# Patient Record
Sex: Female | Born: 1980
Health system: Southern US, Community
[De-identification: ages and names within clinical notes are randomized; demographics above are authoritative.]

## PROBLEM LIST (undated history)

## (undated) DIAGNOSIS — G43909 Migraine, unspecified, not intractable, without status migrainosus: Secondary | ICD-10-CM

## (undated) DIAGNOSIS — R87629 Unspecified abnormal cytological findings in specimens from vagina: Secondary | ICD-10-CM

## (undated) DIAGNOSIS — R87619 Unspecified abnormal cytological findings in specimens from cervix uteri: Secondary | ICD-10-CM

## (undated) DIAGNOSIS — N390 Urinary tract infection, site not specified: Secondary | ICD-10-CM

## (undated) DIAGNOSIS — R202 Paresthesia of skin: Secondary | ICD-10-CM

## (undated) DIAGNOSIS — IMO0002 Reserved for concepts with insufficient information to code with codable children: Secondary | ICD-10-CM

## (undated) DIAGNOSIS — F411 Generalized anxiety disorder: Secondary | ICD-10-CM

## (undated) DIAGNOSIS — E559 Vitamin D deficiency, unspecified: Secondary | ICD-10-CM

## (undated) HISTORY — DX: Migraine, unspecified, not intractable, without status migrainosus: G43.909

## (undated) HISTORY — DX: Unspecified abnormal cytological findings in specimens from cervix uteri: R87.619

## (undated) HISTORY — PX: OTHER SURGICAL HISTORY: SHX169

## (undated) HISTORY — PX: COLPOSCOPY: SHX161

## (undated) HISTORY — DX: Unspecified abnormal cytological findings in specimens from vagina: R87.629

## (undated) HISTORY — DX: Paresthesia of skin: R20.2

## (undated) HISTORY — DX: Reserved for concepts with insufficient information to code with codable children: IMO0002

## (undated) HISTORY — DX: Vitamin D deficiency, unspecified: E55.9

## (undated) HISTORY — DX: Urinary tract infection, site not specified: N39.0

## (undated) HISTORY — DX: Generalized anxiety disorder: F41.1

---
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
## 2012-02-18 ENCOUNTER — Encounter: Payer: Self-pay | Admitting: Obstetrics and Gynecology

## 2012-02-18 ENCOUNTER — Ambulatory Visit (INDEPENDENT_AMBULATORY_CARE_PROVIDER_SITE_OTHER): Payer: BC Managed Care – PPO | Admitting: Obstetrics and Gynecology

## 2012-02-18 VITALS — BP 118/76 | Ht 67.0 in | Wt 153.0 lb

## 2012-02-18 DIAGNOSIS — IMO0001 Reserved for inherently not codable concepts without codable children: Secondary | ICD-10-CM

## 2012-02-18 DIAGNOSIS — Z124 Encounter for screening for malignant neoplasm of cervix: Secondary | ICD-10-CM

## 2012-02-18 DIAGNOSIS — Z202 Contact with and (suspected) exposure to infections with a predominantly sexual mode of transmission: Secondary | ICD-10-CM

## 2012-02-18 DIAGNOSIS — N63 Unspecified lump in unspecified breast: Secondary | ICD-10-CM

## 2012-02-18 DIAGNOSIS — Z309 Encounter for contraceptive management, unspecified: Secondary | ICD-10-CM

## 2012-02-18 NOTE — Progress Notes (Signed)
Last Pap: 07/2010 WNL: Yes Regular Periods:yes Contraception: none  Monthly Breast exam:yes Tetanus<3yrs:yes Nl.Bladder Function:yes Daily BMs:yes Healthy Diet:yes Calcium:yes Mammogram:no Exercise:yes Seatbelt: yes Abuse at home: no Stressful work:no Sigmoid-colonoscopy: n/a Bone Density: No  Pt wants to discuss birth control options.  Pt denies any history of gallbladder,liver disease or history of vte.  Pt eith migraines but no neurologic component BP 118/76  Ht 5\' 7"  (1.702 m)  Wt 153 lb (69.4 kg)  BMI 23.96 kg/m2  LMP 02/06/2012 Pt without complaints Physical Examination: General appearance - alert, well appearing, and in no distress Mental status - normal mood, behavior, speech, dress, motor activity, and thought processes Neck - supple, no significant adenopathy, thyroid exam: thyroid is normal in size without nodules or tenderness Chest - clear to auscultation, no wheezes, rales or rhonchi, symmetric air entry Heart - normal rate and regular rhythm Abdomen - soft, nontender, nondistended, no masses or organomegaly Breasts - breasts appear normal, no suspicious masses, no skin or nipple changes or axillary nodes Pelvic - normal external genitalia, vulva, vagina, cervix, uterus and adnexa Rectal - normal rectal, no masses, rectal exam not indicated Back exam - full range of motion, no tenderness, palpable spasm or pain on motion Neurological - alert, oriented, normal speech, no focal findings or movement disorder noted Musculoskeletal - no joint tenderness, deformity or swelling Extremities - no edema, redness or tenderness in the calves or thighs Skin - normal coloration and turgor, no rashes, no suspicious skin lesions noted Routine exam Pap sent yes Mammogram due no pt chose birth control.  she was given lo loestrin RT 4 months

## 2012-02-18 NOTE — Patient Instructions (Signed)

## 2012-02-19 LAB — HEPATITIS C ANTIBODY: HCV Ab: NEGATIVE

## 2012-02-19 LAB — HIV ANTIBODY (ROUTINE TESTING W REFLEX): HIV: NONREACTIVE

## 2012-02-20 LAB — PAP IG, CT-NG, RFX HPV ASCU
Chlamydia Probe Amp: NEGATIVE
GC Probe Amp: NEGATIVE

## 2012-02-24 ENCOUNTER — Telehealth: Payer: Self-pay

## 2012-02-24 NOTE — Telephone Encounter (Signed)
Lm on vm tcb rgd labs 

## 2012-02-24 NOTE — Telephone Encounter (Signed)
Spoke with pt rgd labs informed pap abnl need colpo explained colpo to pt . Pt has appt 03/12/12 at 345 with ND pt voice understandnig

## 2012-02-24 NOTE — Telephone Encounter (Signed)
Message copied by Rolla Plate on Mon Feb 24, 2012  9:43 AM ------      Message from: Jaymes Graff      Created: Sat Feb 22, 2012  1:55 AM       Please schedule pt for colposcopy.

## 2012-03-05 ENCOUNTER — Other Ambulatory Visit: Payer: Self-pay

## 2012-03-06 ENCOUNTER — Ambulatory Visit
Admission: RE | Admit: 2012-03-06 | Discharge: 2012-03-06 | Disposition: A | Discharge status: Home / Self Care | Payer: BC Managed Care – PPO | Source: Ambulatory Visit | Attending: Obstetrics and Gynecology | Admitting: Obstetrics and Gynecology

## 2012-03-06 DIAGNOSIS — N63 Unspecified lump in unspecified breast: Secondary | ICD-10-CM

## 2012-03-12 ENCOUNTER — Encounter: Payer: BC Managed Care – PPO | Admitting: Obstetrics and Gynecology

## 2012-04-01 ENCOUNTER — Telehealth: Payer: Self-pay | Admitting: Obstetrics and Gynecology

## 2012-04-01 NOTE — Telephone Encounter (Signed)
Niccole/resched.

## 2012-04-01 NOTE — Telephone Encounter (Signed)
Spoke with pt rgd msg pt need to r/s colpo ND in surgery 04/15/12 r/s colpo 04/22/12 at 11:15 with ND pt voice understanding

## 2012-04-15 ENCOUNTER — Encounter: Payer: BC Managed Care – PPO | Admitting: Obstetrics and Gynecology

## 2012-04-22 ENCOUNTER — Encounter: Payer: BC Managed Care – PPO | Admitting: Obstetrics and Gynecology

## 2012-04-29 ENCOUNTER — Telehealth: Payer: Self-pay | Admitting: Obstetrics and Gynecology

## 2012-04-29 NOTE — Telephone Encounter (Signed)
Spoke with pt rgd msg need to r/s colpo pt has appt 05/13/12 at 3:15 pt voice understanding

## 2012-05-06 ENCOUNTER — Other Ambulatory Visit: Payer: Self-pay | Admitting: Obstetrics and Gynecology

## 2012-05-06 ENCOUNTER — Other Ambulatory Visit: Payer: Self-pay

## 2012-05-06 MED ORDER — NORETHIN-ETH ESTRAD-FE BIPHAS 1 MG-10 MCG / 10 MCG PO TABS: 1.0000 | ORAL_TABLET | Freq: Every day | ORAL | Status: DC

## 2012-05-06 NOTE — Telephone Encounter (Signed)
NICCOLE/nd pt

## 2012-05-06 NOTE — Telephone Encounter (Signed)
Spoke with pt rgd msg informed rx sent to pharm pt voice understanding 

## 2012-05-13 ENCOUNTER — Encounter: Payer: Self-pay | Admitting: Obstetrics and Gynecology

## 2012-05-13 ENCOUNTER — Ambulatory Visit (INDEPENDENT_AMBULATORY_CARE_PROVIDER_SITE_OTHER): Payer: BC Managed Care – PPO | Admitting: Obstetrics and Gynecology

## 2012-05-13 VITALS — BP 118/76 | Resp 16 | Ht 67.0 in | Wt 152.0 lb

## 2012-05-13 DIAGNOSIS — R87612 Low grade squamous intraepithelial lesion on cytologic smear of cervix (LGSIL): Secondary | ICD-10-CM

## 2012-05-13 NOTE — Patient Instructions (Signed)
Colposcopy Care After Colposcopy is a procedure in which a special tool is used to magnify the surface of the cervix. A tissue sample (biopsy) may also be taken. This sample will be looked at for cervical cancer or other problems. After the test:  You may have some cramping.   Lie down for a few minutes if you feel lightheaded.    You may have some bleeding which should stop in a few days.  HOME CARE  Do not have sex or use tampons for 2 to 3 days or as told.   Only take medicine as told by your doctor.   Continue to take your birth control pills as usual.  Finding out the results of your test Ask when your test results will be ready. Make sure you get your test results. GET HELP RIGHT AWAY IF:  You are bleeding a lot or are passing blood clots.   You develop a fever of 102 F (38.9 C) or higher.   You have abnormal vaginal discharge.   You have cramps that do not go away with medicine.   You feel lightheaded, dizzy, or pass out (faint).  MAKE SURE YOU:   Understand these instructions.   Will watch your condition.   Will get help right away if you are not doing well or get worse.  Document Released: 03/18/2008 Document Revised: 09/19/2011 Document Reviewed: 03/18/2008 Exit[employer] Patient Information 2012 [employer]. 

## 2012-05-13 NOTE — Progress Notes (Signed)
Previous Pap Smear: 02-18-2012  EPITHELIAL CELL ABNORMALITY: SQUAMOUS CELLS LOW-GRADE SQUAMOUS INTRAEPITHELIAL LESION (LSIL) ENCOMPASSING: HPV/MILD DYSPLASIA/CIN1.  Previous Colposcopy: N/A Referred From: Dr.Alaja Goldinger  LMP: 04/30/12 Contraception: Lo Loestrin FE G,P: 0,0colpo adequate AW change at 1 o clock bx at 1 oclock with ECC done  RT 2 weeks

## 2012-06-01 ENCOUNTER — Encounter: Payer: Self-pay | Admitting: Obstetrics and Gynecology

## 2012-06-01 ENCOUNTER — Ambulatory Visit (INDEPENDENT_AMBULATORY_CARE_PROVIDER_SITE_OTHER): Payer: BC Managed Care – PPO | Admitting: Obstetrics and Gynecology

## 2012-06-01 VITALS — BP 104/70 | Ht 67.0 in | Wt 155.0 lb

## 2012-06-01 DIAGNOSIS — N870 Mild cervical dysplasia: Secondary | ICD-10-CM

## 2012-06-01 NOTE — Progress Notes (Signed)
F/u colpo results  Pathology Status: Final result MyChart: Not Released Next appt with me: None Dx: Papanicolaou smear of cervix with low...         Component  Value    Report      Comments:    FINAL DIAGNOSIS: A. Endocervix - Curettage:  Tissue did not survive processing; see gross description. B. Cervix- Biopsy, 1 o'clock:  Low grade squamous intraepithelial lesion (LSIL), mild dysplasia and HPV infection, CIN I.  See comment.  COMMENT: The above findings correlate with the patient's previous pap test ([id number], [facility]).  Daleen Bo, MD, FCAP Electronically Signed CLINICAL HISTORY: 795.03 SOURCE OF SPECIMEN: A: Endocervix - Curettage B: Cervix- Biopsy, 1 o'clock GROSS DESCRIPTION: There are two containers. A. Received in a single container of formalin labeled with the patient's name and "ECC" are multiple tan mucoid pieces measuring less than 0.1 x 0.1 x 0.1 cm. Submitted entirely in cassette A. DISCLAIMER: Due to specimen fragility/size, the specimen may not survive processing. EPT/srt  B. Received in a single container of formalin labeled with the patient's name and "1 o'clock" is one tan soft tissue fragment measuring 0.4 x 0.4 x 0.3 cm. Submitted entirely in cassette B. EPT/srt    BP 104/70  Ht 5\' 7"  (1.702 m)  Wt 155 lb (70.308 kg)  BMI 24.28 kg/m2  LMP 04/30/2012 LGSIL on pap and colpo Pathophysiology explained to the patient RT for pap in 6 months

## 2012-06-16 ENCOUNTER — Telehealth: Payer: Self-pay

## 2012-06-16 NOTE — Telephone Encounter (Signed)
Try calling pt rgd labs number not in service will try again later

## 2012-06-16 NOTE — Telephone Encounter (Signed)
Message copied by Rolla Plate on Tue Jun 16, 2012 10:58 AM ------      Message from: Jaymes Graff      Created: Sat Jun 13, 2012 11:57 PM       Please review colpo results with patient and tell her I recommend a pap every six months for the next year.

## 2012-06-18 NOTE — Telephone Encounter (Signed)
Spoke with pt rgd labs informed colpo consistent with pap repat pap every 6 months pt voice understanding

## 2012-06-18 NOTE — Telephone Encounter (Signed)
Lm on vm tcb rgd labs 

## 2012-10-14 NOTE — L&D Delivery Note (Signed)
Delivery Note I was called to attend this delivery due to rapid progression of 2nd stage labor. At 5:37 AM a viable female was delivered via Vaginal, Spontaneous Delivery (Presentation:LOA ;  ). A loose nuchal cord was reduced, and the body delivered easily with a compound hang posteriorly.  2 minutes afterwards, Dr. Tenny Craw arrived and care was turned over to her.   Catherine Price,Catherine Price 5:45 AM  APGAR: 8, 9; weight pending.   Placenta status: spontaneous, intact.  Cord 3V:    Anesthesia:  Epidural Episiotomy: none Lacerations: second degree Suture Repair: 3-0 vicryl Est. Blood Loss (mL): 350 cc  Mom to postpartum.  Baby to nursery-stable.  Catherine Price,Catherine Price 07/23/2013, 5:43 AM

## 2013-02-17 LAB — OB RESULTS CONSOLE ANTIBODY SCREEN: Antibody Screen: NEGATIVE

## 2013-02-17 LAB — OB RESULTS CONSOLE GC/CHLAMYDIA
Chlamydia: NEGATIVE
Gonorrhea: NEGATIVE

## 2013-02-17 LAB — OB RESULTS CONSOLE HIV ANTIBODY (ROUTINE TESTING): HIV: NONREACTIVE

## 2013-07-22 ENCOUNTER — Encounter (HOSPITAL_COMMUNITY): Payer: Self-pay

## 2013-07-22 ENCOUNTER — Inpatient Hospital Stay (HOSPITAL_COMMUNITY)
Admission: AD | Admit: 2013-07-22 | Discharge: 2013-07-25 | DRG: 373 | Disposition: A | Discharge status: Home / Self Care | Payer: BC Managed Care – PPO | Source: Ambulatory Visit | Attending: Obstetrics and Gynecology | Admitting: Obstetrics and Gynecology

## 2013-07-22 ENCOUNTER — Encounter (HOSPITAL_COMMUNITY): Payer: Self-pay | Admitting: *Deleted

## 2013-07-22 ENCOUNTER — Inpatient Hospital Stay (HOSPITAL_COMMUNITY): Payer: BC Managed Care – PPO | Admitting: Anesthesiology

## 2013-07-22 ENCOUNTER — Inpatient Hospital Stay (HOSPITAL_COMMUNITY)
Admission: AD | Admit: 2013-07-22 | Discharge: 2013-07-22 | Disposition: A | Discharge status: Home / Self Care | Payer: BC Managed Care – PPO | Source: Ambulatory Visit | Attending: Obstetrics and Gynecology | Admitting: Obstetrics and Gynecology

## 2013-07-22 ENCOUNTER — Encounter (HOSPITAL_COMMUNITY): Payer: BC Managed Care – PPO | Admitting: Anesthesiology

## 2013-07-22 DIAGNOSIS — O479 False labor, unspecified: Secondary | ICD-10-CM | POA: Insufficient documentation

## 2013-07-22 LAB — CBC
MCH: 31 pg (ref 26.0–34.0)
MCHC: 35 g/dL (ref 30.0–36.0)
MCV: 88.7 fL (ref 78.0–100.0)
Platelets: 257 10*3/uL (ref 150–400)
RBC: 4.06 MIL/uL (ref 3.87–5.11)

## 2013-07-22 LAB — TYPE AND SCREEN: Antibody Screen: NEGATIVE

## 2013-07-22 MED ORDER — IBUPROFEN 600 MG PO TABS: 600.0000 mg | ORAL_TABLET | Freq: Four times a day (QID) | ORAL | Status: DC | PRN

## 2013-07-22 MED ORDER — OXYTOCIN 40 UNITS IN LACTATED RINGERS INFUSION - SIMPLE MED
62.5000 mL/h | INTRAVENOUS | Status: DC
  Filled 2013-07-22: qty 1000

## 2013-07-22 MED ORDER — OXYTOCIN BOLUS FROM INFUSION
500.0000 mL | INTRAVENOUS | Status: DC
  Administered 2013-07-23: 500 mL via INTRAVENOUS

## 2013-07-22 MED ORDER — LIDOCAINE HCL (PF) 1 % IJ SOLN
30.0000 mL | INTRAMUSCULAR | Status: AC | PRN
  Administered 2013-07-23: 30 mL via SUBCUTANEOUS
  Filled 2013-07-22 (×2): qty 30

## 2013-07-22 MED ORDER — ONDANSETRON HCL 4 MG/2ML IJ SOLN
4.0000 mg | Freq: Four times a day (QID) | INTRAMUSCULAR | Status: DC | PRN
  Administered 2013-07-22: 4 mg via INTRAVENOUS
  Filled 2013-07-22: qty 2

## 2013-07-22 MED ORDER — ACETAMINOPHEN 325 MG PO TABS: 650.0000 mg | ORAL_TABLET | ORAL | Status: DC | PRN

## 2013-07-22 MED ORDER — LACTATED RINGERS IV SOLN
INTRAVENOUS | Status: DC
  Administered 2013-07-22 – 2013-07-23 (×2): via INTRAVENOUS

## 2013-07-22 MED ORDER — PHENYLEPHRINE 40 MCG/ML (10ML) SYRINGE FOR IV PUSH (FOR BLOOD PRESSURE SUPPORT)
80.0000 ug | PREFILLED_SYRINGE | INTRAVENOUS | Status: DC | PRN
  Filled 2013-07-22: qty 5
  Filled 2013-07-22: qty 2

## 2013-07-22 MED ORDER — LACTATED RINGERS IV SOLN
500.0000 mL | INTRAVENOUS | Status: DC | PRN
  Administered 2013-07-23: 500 mL via INTRAVENOUS

## 2013-07-22 MED ORDER — DIPHENHYDRAMINE HCL 50 MG/ML IJ SOLN: 12.5000 mg | INTRAMUSCULAR | Status: DC | PRN

## 2013-07-22 MED ORDER — CITRIC ACID-SODIUM CITRATE 334-500 MG/5ML PO SOLN
30.0000 mL | ORAL | Status: DC | PRN
  Administered 2013-07-22: 30 mL via ORAL
  Filled 2013-07-22: qty 15

## 2013-07-22 MED ORDER — EPHEDRINE 5 MG/ML INJ
10.0000 mg | INTRAVENOUS | Status: DC | PRN
  Filled 2013-07-22: qty 2
  Filled 2013-07-22: qty 4

## 2013-07-22 MED ORDER — OXYCODONE-ACETAMINOPHEN 5-325 MG PO TABS: 1.0000 | ORAL_TABLET | ORAL | Status: DC | PRN

## 2013-07-22 MED ORDER — FLEET ENEMA 7-19 GM/118ML RE ENEM: 1.0000 | ENEMA | RECTAL | Status: DC | PRN

## 2013-07-22 MED ORDER — FENTANYL 2.5 MCG/ML BUPIVACAINE 1/10 % EPIDURAL INFUSION (WH - ANES)
14.0000 mL/h | INTRAMUSCULAR | Status: DC | PRN
  Administered 2013-07-22 – 2013-07-23 (×2): 14 mL/h via EPIDURAL
  Filled 2013-07-22 (×2): qty 125

## 2013-07-22 MED ORDER — PHENYLEPHRINE 40 MCG/ML (10ML) SYRINGE FOR IV PUSH (FOR BLOOD PRESSURE SUPPORT)
80.0000 ug | PREFILLED_SYRINGE | INTRAVENOUS | Status: DC | PRN
  Filled 2013-07-22: qty 2

## 2013-07-22 MED ORDER — BUTORPHANOL TARTRATE 1 MG/ML IJ SOLN: 1.0000 mg | INTRAMUSCULAR | Status: DC | PRN

## 2013-07-22 MED ORDER — LIDOCAINE HCL (PF) 1 % IJ SOLN
INTRAMUSCULAR | Status: DC | PRN
  Administered 2013-07-22 (×2): 5 mL

## 2013-07-22 MED ORDER — LACTATED RINGERS IV SOLN: 500.0000 mL | Freq: Once | INTRAVENOUS | Status: DC

## 2013-07-22 MED ORDER — EPHEDRINE 5 MG/ML INJ
10.0000 mg | INTRAVENOUS | Status: DC | PRN
  Filled 2013-07-22: qty 2

## 2013-07-22 NOTE — MAU Note (Signed)
Contractions every 5-7 minutes since midnight. Denies LOF or VB. Positive fetal movement. sve 1cm on Tuesday.

## 2013-07-22 NOTE — MAU Note (Signed)
C/o uc that started last night around 1940; dilated 2-3 cm this AM in MAU;

## 2013-07-22 NOTE — MAU Note (Signed)
Pt taken directly to rm, had intense look on face when in lobby. No bleeding or leaking, was here this morning, ctx's are stronger.

## 2013-07-22 NOTE — Anesthesia Procedure Notes (Signed)
Epidural Patient location during procedure: OB Start time: 07/22/2013 9:32 PM  Staffing Anesthesiologist: Angus Seller., Harrell Gave. Performed by: anesthesiologist   Preanesthetic Checklist Completed: patient identified, site marked, surgical consent, pre-op evaluation, timeout performed, IV checked, risks and benefits discussed and monitors and equipment checked  Epidural Patient position: sitting Prep: site prepped and draped and DuraPrep Patient monitoring: continuous pulse ox and blood pressure Approach: midline Injection technique: LOR air  Needle:  Needle type: Tuohy  Needle gauge: 17 G Needle length: 9 cm and 9 Needle insertion depth: 5 cm cm Catheter type: closed end flexible Catheter size: 19 Gauge Catheter at skin depth: 10 cm Test dose: negative  Assessment Events: blood not aspirated, injection not painful, no injection resistance, negative IV test and no paresthesia  Additional Notes Patient identified.  Risk benefits discussed including failed block, incomplete pain control, headache, nerve damage, paralysis, blood pressure changes, nausea, vomiting, reactions to medication both toxic or allergic, and postpartum back pain.  Patient expressed understanding and wished to proceed.  All questions were answered.  Sterile technique used throughout procedure and epidural site dressed with sterile barrier dressing. No paresthesia or other complications noted.The patient did not experience any signs of intravascular injection such as tinnitus or metallic taste in mouth nor signs of intrathecal spread such as rapid motor block. Please see nursing notes for vital signs.

## 2013-07-22 NOTE — Anesthesia Preprocedure Evaluation (Signed)

## 2013-07-23 ENCOUNTER — Encounter (HOSPITAL_COMMUNITY): Payer: Self-pay | Admitting: *Deleted

## 2013-07-23 LAB — RPR: RPR Ser Ql: NONREACTIVE

## 2013-07-23 LAB — ABO/RH: ABO/RH(D): O POS

## 2013-07-23 MED ORDER — DIBUCAINE 1 % RE OINT: 1.0000 "application " | TOPICAL_OINTMENT | RECTAL | Status: DC | PRN

## 2013-07-23 MED ORDER — SIMETHICONE 80 MG PO CHEW: 80.0000 mg | CHEWABLE_TABLET | ORAL | Status: DC | PRN

## 2013-07-23 MED ORDER — OXYCODONE-ACETAMINOPHEN 5-325 MG PO TABS: 1.0000 | ORAL_TABLET | ORAL | Status: DC | PRN

## 2013-07-23 MED ORDER — DIPHENHYDRAMINE HCL 25 MG PO CAPS: 25.0000 mg | ORAL_CAPSULE | Freq: Four times a day (QID) | ORAL | Status: DC | PRN

## 2013-07-23 MED ORDER — PRENATAL MULTIVITAMIN CH
1.0000 | ORAL_TABLET | Freq: Every day | ORAL | Status: DC
  Administered 2013-07-23 – 2013-07-24 (×2): 1 via ORAL
  Filled 2013-07-23 (×2): qty 1

## 2013-07-23 MED ORDER — ONDANSETRON HCL 4 MG/2ML IJ SOLN: 4.0000 mg | INTRAMUSCULAR | Status: DC | PRN

## 2013-07-23 MED ORDER — TETANUS-DIPHTH-ACELL PERTUSSIS 5-2.5-18.5 LF-MCG/0.5 IM SUSP: 0.5000 mL | Freq: Once | INTRAMUSCULAR | Status: DC

## 2013-07-23 MED ORDER — SENNOSIDES-DOCUSATE SODIUM 8.6-50 MG PO TABS
2.0000 | ORAL_TABLET | ORAL | Status: DC
  Administered 2013-07-23 – 2013-07-25 (×2): 2 via ORAL
  Filled 2013-07-23 (×2): qty 2

## 2013-07-23 MED ORDER — IBUPROFEN 600 MG PO TABS
600.0000 mg | ORAL_TABLET | Freq: Four times a day (QID) | ORAL | Status: DC
  Administered 2013-07-23 – 2013-07-25 (×8): 600 mg via ORAL
  Filled 2013-07-23 (×8): qty 1

## 2013-07-23 MED ORDER — WITCH HAZEL-GLYCERIN EX PADS: 1.0000 "application " | MEDICATED_PAD | CUTANEOUS | Status: DC | PRN

## 2013-07-23 MED ORDER — ONDANSETRON HCL 4 MG PO TABS: 4.0000 mg | ORAL_TABLET | ORAL | Status: DC | PRN

## 2013-07-23 MED ORDER — ZOLPIDEM TARTRATE 5 MG PO TABS: 5.0000 mg | ORAL_TABLET | Freq: Every evening | ORAL | Status: DC | PRN

## 2013-07-23 MED ORDER — LANOLIN HYDROUS EX OINT: TOPICAL_OINTMENT | CUTANEOUS | Status: DC | PRN

## 2013-07-23 MED ORDER — BENZOCAINE-MENTHOL 20-0.5 % EX AERO
1.0000 "application " | INHALATION_SPRAY | CUTANEOUS | Status: DC | PRN
  Administered 2013-07-23: 1 via TOPICAL
  Filled 2013-07-23: qty 56

## 2013-07-23 NOTE — Anesthesia Postprocedure Evaluation (Signed)
Anesthesia Post Note  Patient: Catherine Price  Procedure(s) Performed: * No procedures listed *  Anesthesia type: Epidural  Patient location: Mother/Baby  Post pain: Pain level controlled  Post assessment: Post-op Vital signs reviewed  Last Vitals:  Filed Vitals:   07/23/13 1315  BP: 112/70  Pulse: 92  Temp: 36.4 C  Resp: 18    Post vital signs: Reviewed  Level of consciousness:alert  Complications: No apparent anesthesia complications

## 2013-07-23 NOTE — H&P (Signed)
Catherine Price is a 32 y.o. female presenting for labor 32 yo G1P0 presents for labor History OB History   Grav Para Term Preterm Abortions TAB SAB Ect Mult Living   1 1 1             Past Medical History  Diagnosis Date  . Migraine headache   . Abnormal Pap smear    Past Surgical History  Procedure Laterality Date  . Rhinoplasty  2002  . Wisdom teeth     Family History: family history includes Diabetes type II in her maternal aunt; Hyperlipidemia in her father and mother; Hypertension in her father and mother. Social History:  reports that she has never smoked. She has never used smokeless tobacco. She reports that she does not drink alcohol or use illicit drugs.   Prenatal Transfer Tool  Maternal Diabetes: No Genetic Screening: Normal Maternal Ultrasounds/Referrals: Normal Fetal Ultrasounds or other Referrals:  None Maternal Substance Abuse:  No Significant Maternal Medications:  None Significant Maternal Lab Results:  None Other Comments:  None  ROS  Dilation: 10 Effacement (%): 100 Station: +1;+2 Exam by:: Bertram Millard, RN Blood pressure 118/82, pulse 109, temperature 98.8 F (37.1 C), temperature source Oral, resp. rate 20, SpO2 97.00%, unknown if currently breastfeeding. Exam Physical Exam  Prenatal labs: ABO, Rh: --/--/O POS, O POS (10/09 1955) Antibody: NEG (10/09 1955) Rubella: Immune (05/07 0000) RPR: NON REACTIVE (10/09 1955)  HBsAg:   neg HIV: Non-reactive (05/07 0000)  GBS:   neg  Assessment/Plan: 1) Admit 2) Epidural on request   Macil Crady H. 07/23/2013, 6:23 AM

## 2013-07-24 LAB — CBC
MCH: 30.6 pg (ref 26.0–34.0)
MCV: 90.6 fL (ref 78.0–100.0)
Platelets: 220 10*3/uL (ref 150–400)
RBC: 3.82 MIL/uL — ABNORMAL LOW (ref 3.87–5.11)
RDW: 14.4 % (ref 11.5–15.5)
WBC: 15.7 10*3/uL — ABNORMAL HIGH (ref 4.0–10.5)

## 2013-07-24 NOTE — Discharge Summary (Signed)
Obstetric Discharge Summary Reason for Admission: onset of labor Prenatal Procedures: none Intrapartum Procedures: spontaneous vaginal delivery Postpartum Procedures: none Complications-Operative and Postpartum: 2 degree perineal laceration Hemoglobin  Date Value Range Status  07/24/2013 11.7* 12.0 - 15.0 g/dL Final     HCT  Date Value Range Status  07/24/2013 34.6* 36.0 - 46.0 % Final    Discharge Diagnoses: Term Pregnancy-delivered  Discharge Information: Date: 07/24/2013 Activity: pelvic rest Diet: routine Medications: Ibuprofen Condition: stable Instructions: refer to practice specific booklet Discharge to: home Follow-up Information   Follow up with Almon Hercules., MD.   Specialty:  Obstetrics and Gynecology   Contact information:   [address] [place] [address] [phone]       Newborn Data: Live born female  Birth Weight: 6 lb 10.8 oz (3028 g) APGAR: 8, 9  Home with mother.  Moishe Schellenberg A 07/24/2013, 9:36 AM

## 2013-07-24 NOTE — Progress Notes (Signed)
Patient is eating, ambulating, voiding.  Pain control is good.  Filed Vitals:   07/23/13 1315 07/23/13 1711 07/23/13 2115 07/24/13 0523  BP: 112/70 120/83 120/75 115/78  Pulse: 92 83 88 92  Temp: 97.6 F (36.4 C) 97.7 F (36.5 C) 97.6 F (36.4 C) 97.9 F (36.6 C)  TempSrc: Oral Oral Oral Oral  Resp: 18 18 20 18   SpO2: 96%  97%     Fundus firm Perineum without swelling.  Lab Results  Component Value Date   WBC 15.7* 07/24/2013   HGB 11.7* 07/24/2013   HCT 34.6* 07/24/2013   MCV 90.6 07/24/2013   PLT 220 07/24/2013    --/--/O POS, O POS (10/09 1955)/RI  A/P Post partum day 1.  Routine care.  Expect d/c per plan.    Noma Quijas A

## 2013-07-25 ENCOUNTER — Encounter (HOSPITAL_COMMUNITY): Payer: Self-pay | Admitting: *Deleted

## 2013-07-25 MED ORDER — INFLUENZA VAC SPLIT QUAD 0.5 ML IM SUSP
0.5000 mL | Freq: Once | INTRAMUSCULAR | Status: AC
  Administered 2013-07-25: 0.5 mL via INTRAMUSCULAR
  Filled 2013-07-25: qty 0.5

## 2013-07-25 NOTE — Progress Notes (Signed)
Patient is eating, ambulating, voiding.  Pain control is good.  Filed Vitals:   07/23/13 2115 07/24/13 0523 07/24/13 1705 07/25/13 0612  BP: 120/75 115/78 118/78 109/63  Pulse: 88 92 88 78  Temp: 97.6 F (36.4 C) 97.9 F (36.6 C) 97.6 F (36.4 C) 98.1 F (36.7 C)  TempSrc: Oral Oral Oral Oral  Resp: 20 18 18 16   SpO2: 97%   98%    Fundus firm Perineum without swelling.  Lab Results  Component Value Date   WBC 15.7* 07/24/2013   HGB 11.7* 07/24/2013   HCT 34.6* 07/24/2013   MCV 90.6 07/24/2013   PLT 220 07/24/2013    --/--/O POS, O POS (10/09 1955)/RI  A/P Post partum day 2.  Routine care.  Expect d/c today.    [name] A

## 2013-12-07 ENCOUNTER — Ambulatory Visit: Payer: BC Managed Care – PPO | Admitting: Family Medicine

## 2013-12-27 ENCOUNTER — Encounter: Payer: Self-pay | Admitting: Family Medicine

## 2013-12-27 ENCOUNTER — Ambulatory Visit (INDEPENDENT_AMBULATORY_CARE_PROVIDER_SITE_OTHER): Payer: BC Managed Care – PPO | Admitting: Family Medicine

## 2013-12-27 VITALS — BP 110/74 | Temp 97.6°F | Ht 66.0 in | Wt 164.0 lb

## 2013-12-27 DIAGNOSIS — Z7689 Persons encountering health services in other specified circumstances: Secondary | ICD-10-CM

## 2013-12-27 DIAGNOSIS — Z8669 Personal history of other diseases of the nervous system and sense organs: Secondary | ICD-10-CM

## 2013-12-27 DIAGNOSIS — Z7189 Other specified counseling: Secondary | ICD-10-CM

## 2013-12-27 DIAGNOSIS — Z13220 Encounter for screening for lipoid disorders: Secondary | ICD-10-CM

## 2013-12-27 NOTE — Progress Notes (Signed)
Chief Complaint  Patient presents with  . Establish Care    HPI:  Catherine Price is here to establish care.  Wants to check lipids and get recs for dermatology. Last PCP and physical: Goes to [place] for her gyn care, all UTD.   Has the following chronic problems and concerns today:  Patient Active Problem List   Diagnosis Date Noted  . CIN I (cervical intraepithelial neoplasia I) 06/01/2012  . Contact with or exposure to venereal diseases 02/18/2012  . Breast lump in upper outer quadrant 02/18/2012    Health Maintenance: UTD  ROS: See pertinent positives and negatives per HPI.  Past Medical History  Diagnosis Date  . Migraine headache   . Abnormal Pap smear   . UTI (urinary tract infection)     Family History  Problem Relation Age of Onset  . Diabetes type II Maternal Aunt   . Hypertension Mother   . Hypertension Father   . Hyperlipidemia Mother   . Hyperlipidemia Father   . Alcoholism      History   Social History  . Marital Status: Married    Spouse Name: N/A    Number of Children: N/A  . Years of Education: N/A   Social History Main Topics  . Smoking status: Never Smoker   . Smokeless tobacco: Never Used  . Alcohol Use: No  . Drug Use: No  . Sexual Activity: Yes   Other Topics Concern  . None   Social History Narrative   Work or School: lab in IT trainer Situation: lives with husband and daughter      Spiritual Beliefs: Christian      Lifestyle: no regular exercise; diet is good             Current outpatient prescriptions:Prenatal Vit-Fe Fumarate-FA (PRENATAL MULTIVITAMIN) TABS tablet, Take 1 tablet by mouth daily at 12 noon., Disp: , Rfl:   EXAM:  Filed Vitals:   12/27/13 1629  BP: 110/74  Temp: 97.6 F (36.4 C)    Body mass index is 26.48 kg/(m^2).  GENERAL: vitals reviewed and listed above, alert, oriented, appears well hydrated and in no acute distress  HEENT: atraumatic, conjunttiva clear, no obvious  abnormalities on inspection of external nose and ears  NECK: no obvious masses on inspection  LUNGS: clear to auscultation bilaterally, no wheezes, rales or rhonchi, good air movement  CV: HRRR, no peripheral edema  MS: moves all extremities without noticeable abnormality  PSYCH: pleasant and cooperative, no obvious depression or anxiety  ASSESSMENT AND PLAN:  Discussed the following assessment and plan:  Encounter to establish care  Hx of migraine headaches  Screening for hyperlipidemia - Plan: Lipid panel  -We reviewed the PMH, PSH, FH, SH, Meds and Allergies. -We provided refills for any medications we will prescribe as needed. -We addressed current concerns per orders and patient instructions. -We have asked for records for pertinent exams, studies, vaccines and notes from previous providers. -We have advised patient to follow up per instructions below. -screening for HLD -follow up as needed  -Patient advised to return or notify a doctor immediately if symptoms worsen or persist or new concerns arise.  Patient Instructions  -We have ordered labs or studies at this visit. It can take up to 1-2 weeks for results and processing. We will contact you with instructions IF your results are abnormal. Normal results will be released to your [facility]. If you have not heard from [place] or can  not find your results in [facility] in 2 weeks please contact our office.  -PLEASE SIGN UP FOR MYCHART TODAY   We recommend the following healthy lifestyle measures: - eat a healthy diet consisting of lots of vegetables, fruits, beans, nuts, seeds, healthy meats such as white chicken and fish and whole grains.  - avoid fried foods, fast food, processed foods, sodas, red meet and other fattening foods.  - get a least 150 minutes of aerobic exercise per week.   Follow up in: as needed and yearly      Daviana Haymaker, Dahlia ClientHANNAH R.

## 2013-12-27 NOTE — Progress Notes (Signed)
Pre visit review using our clinic review tool, if applicable. No additional management support is needed unless otherwise documented below in the visit note. 

## 2013-12-27 NOTE — Patient Instructions (Signed)
-  We have ordered labs or studies at this visit. It can take up to 1-2 weeks for results and processing. We will contact you with instructions IF your results are abnormal. Normal results will be released to your [facility]. If you have not heard from us or can not find your results in [facility] in 2 weeks please contact our office.  -PLEASE SIGN UP FOR MYCHART TODAY   We recommend the following healthy lifestyle measures: - eat a healthy diet consisting of lots of vegetables, fruits, beans, nuts, seeds, healthy meats such as white chicken and fish and whole grains.  - avoid fried foods, fast food, processed foods, sodas, red meet and other fattening foods.  - get a least 150 minutes of aerobic exercise per week.   Follow up in: as needed and yearly

## 2013-12-28 LAB — LIPID PANEL
CHOLESTEROL: 138 mg/dL (ref 0–200)
HDL: 44.3 mg/dL (ref >39.00)
LDL CALC: 78 mg/dL (ref 0–99)
TRIGLYCERIDES: 77 mg/dL (ref 0.0–149.0)
Total CHOL/HDL Ratio: 3
VLDL: 15.4 mg/dL (ref 0.0–40.0)

## 2014-08-15 ENCOUNTER — Encounter: Payer: Self-pay | Admitting: Family Medicine

## 2015-05-16 LAB — OB RESULTS CONSOLE ABO/RH: RH TYPE: POSITIVE

## 2015-05-16 LAB — OB RESULTS CONSOLE GC/CHLAMYDIA
Chlamydia: NEGATIVE
GC PROBE AMP, GENITAL: NEGATIVE

## 2015-05-16 LAB — OB RESULTS CONSOLE HEPATITIS B SURFACE ANTIGEN: Hepatitis B Surface Ag: NEGATIVE

## 2015-05-16 LAB — OB RESULTS CONSOLE RPR: RPR: NONREACTIVE

## 2015-05-16 LAB — OB RESULTS CONSOLE RUBELLA ANTIBODY, IGM: RUBELLA: IMMUNE

## 2015-05-16 LAB — OB RESULTS CONSOLE HIV ANTIBODY (ROUTINE TESTING): HIV: NONREACTIVE

## 2015-05-16 LAB — OB RESULTS CONSOLE ANTIBODY SCREEN: Antibody Screen: NEGATIVE

## 2016-01-03 ENCOUNTER — Encounter (HOSPITAL_COMMUNITY): Payer: Self-pay | Admitting: *Deleted

## 2016-01-03 ENCOUNTER — Telehealth (HOSPITAL_COMMUNITY): Payer: Self-pay | Admitting: *Deleted

## 2016-01-03 LAB — OB RESULTS CONSOLE GBS: GBS: NEGATIVE

## 2016-01-03 NOTE — Telephone Encounter (Signed)
Preadmission screen  

## 2016-01-06 ENCOUNTER — Inpatient Hospital Stay (HOSPITAL_COMMUNITY): Payer: BLUE CROSS/BLUE SHIELD | Admitting: Anesthesiology

## 2016-01-06 ENCOUNTER — Encounter (HOSPITAL_COMMUNITY): Payer: Self-pay

## 2016-01-06 ENCOUNTER — Inpatient Hospital Stay (HOSPITAL_COMMUNITY)
Admission: EM | Admit: 2016-01-06 | Discharge: 2016-01-07 | DRG: 775 | Disposition: A | Discharge status: Home / Self Care | Payer: BLUE CROSS/BLUE SHIELD | Source: Ambulatory Visit | Attending: Obstetrics and Gynecology | Admitting: Obstetrics and Gynecology

## 2016-01-06 DIAGNOSIS — Z811 Family history of alcohol abuse and dependence: Secondary | ICD-10-CM | POA: Diagnosis not present

## 2016-01-06 DIAGNOSIS — Z3A40 40 weeks gestation of pregnancy: Secondary | ICD-10-CM

## 2016-01-06 DIAGNOSIS — Z8249 Family history of ischemic heart disease and other diseases of the circulatory system: Secondary | ICD-10-CM

## 2016-01-06 DIAGNOSIS — Z833 Family history of diabetes mellitus: Secondary | ICD-10-CM

## 2016-01-06 LAB — CBC
HCT: 34.8 % — ABNORMAL LOW (ref 36.0–46.0)
HEMATOCRIT: 36.6 % (ref 36.0–46.0)
HEMOGLOBIN: 12.7 g/dL (ref 12.0–15.0)
Hemoglobin: 12.1 g/dL (ref 12.0–15.0)
MCH: 31.1 pg (ref 26.0–34.0)
MCH: 31.1 pg (ref 26.0–34.0)
MCHC: 34.7 g/dL (ref 30.0–36.0)
MCHC: 34.8 g/dL (ref 30.0–36.0)
MCV: 89.5 fL (ref 78.0–100.0)
MCV: 89.5 fL (ref 78.0–100.0)
Platelets: 258 10*3/uL (ref 150–400)
Platelets: 230 10*3/uL (ref 150–400)
RBC: 4.09 MIL/uL (ref 3.87–5.11)
RBC: 3.89 MIL/uL (ref 3.87–5.11)
RDW: 14.6 % (ref 11.5–15.5)
RDW: 14.6 % (ref 11.5–15.5)
WBC: 11.1 10*3/uL — ABNORMAL HIGH (ref 4.0–10.5)
WBC: 16.2 10*3/uL — AB (ref 4.0–10.5)

## 2016-01-06 LAB — COMPREHENSIVE METABOLIC PANEL
ALBUMIN: 2.7 g/dL — AB (ref 3.5–5.0)
ALK PHOS: 135 U/L — AB (ref 38–126)
ALT: 16 U/L (ref 14–54)
AST: 23 U/L (ref 15–41)
Anion gap: 8 (ref 5–15)
BUN: 9 mg/dL (ref 6–20)
CALCIUM: 8.7 mg/dL — AB (ref 8.9–10.3)
CO2: 24 mmol/L (ref 22–32)
CREATININE: 0.57 mg/dL (ref 0.44–1.00)
Chloride: 105 mmol/L (ref 101–111)
GFR calc Af Amer: >60 mL/min (ref >60)
GFR calc non Af Amer: >60 mL/min (ref >60)
GLUCOSE: 131 mg/dL — AB (ref 65–99)
Potassium: 3.6 mmol/L (ref 3.5–5.1)
SODIUM: 137 mmol/L (ref 135–145)
Total Bilirubin: 0.3 mg/dL (ref 0.3–1.2)
Total Protein: 6.1 g/dL — ABNORMAL LOW (ref 6.5–8.1)

## 2016-01-06 LAB — RPR: RPR Ser Ql: NONREACTIVE

## 2016-01-06 LAB — TYPE AND SCREEN
ABO/RH(D): O POS
ANTIBODY SCREEN: NEGATIVE

## 2016-01-06 LAB — URIC ACID: Uric Acid, Serum: 5.4 mg/dL (ref 2.3–6.6)

## 2016-01-06 MED ORDER — SIMETHICONE 80 MG PO CHEW: 80.0000 mg | CHEWABLE_TABLET | ORAL | Status: DC | PRN

## 2016-01-06 MED ORDER — EPHEDRINE 5 MG/ML INJ
10.0000 mg | INTRAVENOUS | Status: DC | PRN
  Filled 2016-01-06: qty 2

## 2016-01-06 MED ORDER — ONDANSETRON HCL 4 MG PO TABS: 4.0000 mg | ORAL_TABLET | ORAL | Status: DC | PRN

## 2016-01-06 MED ORDER — OXYCODONE-ACETAMINOPHEN 5-325 MG PO TABS
1.0000 | ORAL_TABLET | ORAL | Status: DC | PRN
  Filled 2016-01-06: qty 1

## 2016-01-06 MED ORDER — LACTATED RINGERS IV SOLN
500.0000 mL | INTRAVENOUS | Status: DC | PRN
Start: 2016-01-06 — End: 2016-01-06
  Administered 2016-01-06: 1000 mL via INTRAVENOUS

## 2016-01-06 MED ORDER — ACETAMINOPHEN 325 MG PO TABS: 650.0000 mg | ORAL_TABLET | ORAL | Status: DC | PRN

## 2016-01-06 MED ORDER — OXYCODONE-ACETAMINOPHEN 5-325 MG PO TABS: 2.0000 | ORAL_TABLET | ORAL | Status: DC | PRN

## 2016-01-06 MED ORDER — FENTANYL 2.5 MCG/ML BUPIVACAINE 1/10 % EPIDURAL INFUSION (WH - ANES)
14.0000 mL/h | INTRAMUSCULAR | Status: DC | PRN
  Administered 2016-01-06: 12 mL/h via EPIDURAL
  Filled 2016-01-06: qty 125

## 2016-01-06 MED ORDER — OXYTOCIN BOLUS FROM INFUSION: 500.0000 mL | INTRAVENOUS | Status: DC

## 2016-01-06 MED ORDER — DIBUCAINE 1 % RE OINT: 1.0000 "application " | TOPICAL_OINTMENT | RECTAL | Status: DC | PRN

## 2016-01-06 MED ORDER — ONDANSETRON HCL 4 MG/2ML IJ SOLN: 4.0000 mg | Freq: Four times a day (QID) | INTRAMUSCULAR | Status: DC | PRN

## 2016-01-06 MED ORDER — WITCH HAZEL-GLYCERIN EX PADS: 1.0000 "application " | MEDICATED_PAD | CUTANEOUS | Status: DC | PRN

## 2016-01-06 MED ORDER — PHENYLEPHRINE 40 MCG/ML (10ML) SYRINGE FOR IV PUSH (FOR BLOOD PRESSURE SUPPORT)
80.0000 ug | PREFILLED_SYRINGE | INTRAVENOUS | Status: DC | PRN
  Filled 2016-01-06: qty 20
  Filled 2016-01-06: qty 2

## 2016-01-06 MED ORDER — LIDOCAINE-EPINEPHRINE (PF) 2 %-1:200000 IJ SOLN
INTRAMUSCULAR | Status: DC | PRN
  Administered 2016-01-06: 3 mL

## 2016-01-06 MED ORDER — LACTATED RINGERS IV SOLN: 500.0000 mL | Freq: Once | INTRAVENOUS | Status: DC

## 2016-01-06 MED ORDER — IBUPROFEN 600 MG PO TABS
600.0000 mg | ORAL_TABLET | Freq: Four times a day (QID) | ORAL | Status: DC
  Administered 2016-01-06 – 2016-01-07 (×4): 600 mg via ORAL
  Filled 2016-01-06 (×3): qty 1

## 2016-01-06 MED ORDER — BENZOCAINE-MENTHOL 20-0.5 % EX AERO: 1.0000 "application " | INHALATION_SPRAY | CUTANEOUS | Status: DC | PRN

## 2016-01-06 MED ORDER — DIPHENHYDRAMINE HCL 25 MG PO CAPS: 25.0000 mg | ORAL_CAPSULE | Freq: Four times a day (QID) | ORAL | Status: DC | PRN

## 2016-01-06 MED ORDER — LANOLIN HYDROUS EX OINT: TOPICAL_OINTMENT | CUTANEOUS | Status: DC | PRN

## 2016-01-06 MED ORDER — PHENYLEPHRINE 40 MCG/ML (10ML) SYRINGE FOR IV PUSH (FOR BLOOD PRESSURE SUPPORT)
80.0000 ug | PREFILLED_SYRINGE | INTRAVENOUS | Status: DC | PRN
  Filled 2016-01-06: qty 2

## 2016-01-06 MED ORDER — LACTATED RINGERS IV SOLN
2.5000 [IU]/h | INTRAVENOUS | Status: DC
  Filled 2016-01-06: qty 10

## 2016-01-06 MED ORDER — ZOLPIDEM TARTRATE 5 MG PO TABS: 5.0000 mg | ORAL_TABLET | Freq: Every evening | ORAL | Status: DC | PRN

## 2016-01-06 MED ORDER — LIDOCAINE HCL (PF) 1 % IJ SOLN
30.0000 mL | INTRAMUSCULAR | Status: DC | PRN
Start: 2016-01-06 — End: 2016-01-06
  Filled 2016-01-06: qty 30

## 2016-01-06 MED ORDER — PRENATAL MULTIVITAMIN CH
1.0000 | ORAL_TABLET | Freq: Every day | ORAL | Status: DC
  Administered 2016-01-07: 1 via ORAL
  Filled 2016-01-06: qty 1

## 2016-01-06 MED ORDER — CITRIC ACID-SODIUM CITRATE 334-500 MG/5ML PO SOLN: 30.0000 mL | ORAL | Status: DC | PRN

## 2016-01-06 MED ORDER — SENNOSIDES-DOCUSATE SODIUM 8.6-50 MG PO TABS
2.0000 | ORAL_TABLET | ORAL | Status: DC
  Administered 2016-01-06: 2 via ORAL
  Filled 2016-01-06: qty 2

## 2016-01-06 MED ORDER — TETANUS-DIPHTH-ACELL PERTUSSIS 5-2.5-18.5 LF-MCG/0.5 IM SUSP: 0.5000 mL | Freq: Once | INTRAMUSCULAR | Status: DC

## 2016-01-06 MED ORDER — LACTATED RINGERS IV SOLN
INTRAVENOUS | Status: DC
  Administered 2016-01-06: 09:00:00 via INTRAVENOUS

## 2016-01-06 MED ORDER — DIPHENHYDRAMINE HCL 50 MG/ML IJ SOLN: 12.5000 mg | INTRAMUSCULAR | Status: DC | PRN

## 2016-01-06 MED ORDER — BUPIVACAINE HCL (PF) 0.25 % IJ SOLN
INTRAMUSCULAR | Status: DC | PRN
  Administered 2016-01-06 (×2): 4 mL via EPIDURAL

## 2016-01-06 MED ORDER — ONDANSETRON HCL 4 MG/2ML IJ SOLN: 4.0000 mg | INTRAMUSCULAR | Status: DC | PRN

## 2016-01-06 MED ORDER — FLEET ENEMA 7-19 GM/118ML RE ENEM: 1.0000 | ENEMA | RECTAL | Status: DC | PRN

## 2016-01-06 MED ORDER — OXYCODONE-ACETAMINOPHEN 5-325 MG PO TABS: 1.0000 | ORAL_TABLET | ORAL | Status: DC | PRN

## 2016-01-06 NOTE — Progress Notes (Signed)
Delivery Note At 2:12 PM a viable female was delivered via  (Presentation: ;  ).  APGAR: , ; weight  .   Placenta status:intact , .  Cord: 3 vessels with the following complications:nuchal cord x 1 .  Cord pH: not done  Anesthesia: Epidural  Episiotomy:  none Lacerations:  none Suture Repair: N/A Est. Blood Loss (mL):  200  Mom to postpartum.  Baby to Couplet care / Skin to Skin.  Bria Portales II,Quentavious Rittenhouse E 01/06/2016, 2:19 PM

## 2016-01-06 NOTE — Progress Notes (Signed)
FHT cat one Epidural in Cx 6/C/-2/vtx AROM poss thin mec

## 2016-01-06 NOTE — MAU Note (Addendum)
Contractions for 2.5hrs. Some bloody show. Denies LOF. Was 1cm on Tues

## 2016-01-06 NOTE — Lactation Note (Signed)
This note was copied from a baby's chart. Lactation Consultation Note  P2, Ex BF 7 months. Reviewed hand expression and mother was able to easily express breastmilk. Mother latched baby in cradle hold.  Reminded her to wait until baby opens wide to latch deep on the breast. Baby fussy at the breast.  Attempted in football hold but baby still fussy. Suggest mother place baby STS to relax baby. Mom made aware of O/P services, breastfeeding support groups, community resources, and our phone # for post-discharge questions.  Suggest she call if she needs further assistance.  Patient Name: Catherine Price [id number]'s Date: 01/06/2016 Reason for consult: Initial assessment   Maternal Data Has patient been taught Hand Expression?: Yes Does the patient have breastfeeding experience prior to this delivery?: Yes  Feeding Feeding Type: Breast Fed Length of feed:  (few sucks/fussy)  LATCH Score/Interventions Latch: Repeated attempts needed to sustain latch, nipple held in mouth throughout feeding, stimulation needed to elicit sucking reflex. Intervention(s): Adjust position;Assist with latch;Breast compression  Audible Swallowing: A few with stimulation Intervention(s): Skin to skin;Hand expression  Type of Nipple: Everted at rest and after stimulation  Comfort (Breast/Nipple): Soft / non-tender     Hold (Positioning): No assistance needed to correctly position infant at breast.  LATCH Score: 8  Lactation Tools Discussed/Used     Consult Status Consult Status: Follow-up Date: 01/07/16 Follow-up type: In-patient    Dahlia ByesBerkelhammer, [facility] 01/06/2016, 8:00 PM

## 2016-01-06 NOTE — H&P (Signed)
Sherrlyn HockMonica S Kluger is a 35 y.o. female presenting for UCs. Denies ROM. Maternal Medical History:  Reason for admission: Contractions.   Contractions: Onset was 3-5 hours ago.    Fetal activity: Perceived fetal activity is normal.      OB History    Gravida Para Term Preterm AB TAB SAB Ectopic Multiple Living   2 1 1       1      Past Medical History  Diagnosis Date  . Migraine headache   . Abnormal Pap smear   . UTI (urinary tract infection)   . Vaginal Pap smear, abnormal    Past Surgical History  Procedure Laterality Date  . Rhinoplasty  2002  . Wisdom teeth    . Colposcopy     Family History: family history includes Alcoholism in her brother; Diabetes in her maternal aunt and mother; Diabetes type II in her maternal aunt; Heart attack in her mother; Hyperlipidemia in her father and mother; Hypertension in her father, mother, and sister. Social History:  reports that she has never smoked. She has never used smokeless tobacco. She reports that she does not drink alcohol or use illicit drugs.   Prenatal Transfer Tool  Maternal Diabetes: No Genetic Screening: Normal Maternal Ultrasounds/Referrals: Normal Fetal Ultrasounds or other Referrals:  None Maternal Substance Abuse:  No Significant Maternal Medications:  None Significant Maternal Lab Results:  None Other Comments:  None  Review of Systems  Eyes: Negative for blurred vision.  Gastrointestinal: Negative for abdominal pain.  Neurological: Negative for headaches.    Dilation: 4.5 Effacement (%): 70 Station: -2 Exam by:: ansah-mensah, rnc  Blood pressure 129/92, pulse 91, temperature 97.8 F (36.6 C), temperature source Oral, resp. rate 18, height 5\' 7"  (1.702 m), weight 207 lb 12.8 oz (94.257 kg), last menstrual period 03/24/2015, currently breastfeeding. Maternal Exam:  Uterine Assessment: Contraction strength is moderate.  Contraction frequency is irregular.   Abdomen: Patient reports no abdominal  tenderness.   Fetal Exam Fetal State Assessment: Category I - tracings are normal.     Physical Exam  Cardiovascular: Normal rate and regular rhythm.   Respiratory: Effort normal and breath sounds normal.  GI: Soft. There is no tenderness.  Neurological: She has normal reflexes.    Prenatal labs: ABO, Rh: --/--/O POS (03/25 0405) Antibody: NEG (03/25 0405) Rubella: Immune (08/02 0000) RPR: Nonreactive (08/02 0000)  HBsAg: Negative (08/02 0000)  HIV: Non-reactive (08/02 0000)  GBS: Negative (03/22 0000)   Assessment/Plan: 35 yo G2P1 @ 40 5/7 weeks contracting D/W patient above Epidural prn   Carilyn Woolston II,Srihari Shellhammer E 01/06/2016, 7:34 AM

## 2016-01-06 NOTE — Anesthesia Procedure Notes (Signed)
Epidural Patient location during procedure: OB  Staffing Anesthesiologist: Mayleen Borrero Performed by: anesthesiologist   Preanesthetic Checklist Completed: patient identified, surgical consent, pre-op evaluation, timeout performed, IV checked, risks and benefits discussed and monitors and equipment checked  Epidural Patient position: sitting Prep: DuraPrep Patient monitoring: heart rate, cardiac monitor, continuous pulse ox and blood pressure Approach: midline Location: L3-L4 Injection technique: LOR saline  Needle:  Needle type: Tuohy  Needle gauge: 17 G Needle length: 9 cm Needle insertion depth: 7 cm Catheter type: closed end flexible Catheter size: 19 Gauge Catheter at skin depth: 12 cm Test dose: negative and 2% lidocaine with Epi 1:200 K  Assessment Events: blood not aspirated, injection not painful, no injection resistance, negative IV test and no paresthesia  Additional Notes Reason for block:procedure for pain   

## 2016-01-06 NOTE — Progress Notes (Signed)
Notified of pt arrival in MAU, cervical exam and discomfort level. Will admit to labor and delivery with epidural upon request

## 2016-01-06 NOTE — Progress Notes (Signed)
FHT cat one, good response to scalp stim Cx no change UCs q4-6 cm D/W patient-she wants to wait a while for UCs to improve

## 2016-01-06 NOTE — Anesthesia Preprocedure Evaluation (Signed)
Anesthesia Evaluation  Patient identified by MRN, date of birth, ID band Patient awake    Reviewed: Allergy & Precautions, Patient's Chart, lab work & pertinent test results  History of Anesthesia Complications Negative for: history of anesthetic complications  Airway Mallampati: II  TM Distance: >3 FB Neck ROM: Full    Dental  (+) Teeth Intact   Pulmonary neg pulmonary ROS,    breath sounds clear to auscultation       Cardiovascular negative cardio ROS   Rhythm:Regular     Neuro/Psych  Headaches, negative psych ROS   GI/Hepatic negative GI ROS, Neg liver ROS,   Endo/Other  negative endocrine ROS  Renal/GU negative Renal ROS     Musculoskeletal   Abdominal   Peds  Hematology negative hematology ROS (+)   Anesthesia Other Findings   Reproductive/Obstetrics (+) Pregnancy                             Anesthesia Physical Anesthesia Plan  ASA: II  Anesthesia Plan: Epidural   Post-op Pain Management:    Induction:   Airway Management Planned:   Additional Equipment:   Intra-op Plan:   Post-operative Plan:   Informed Consent: I have reviewed the patients History and Physical, chart, labs and discussed the procedure including the risks, benefits and alternatives for the proposed anesthesia with the patient or authorized representative who has indicated his/her understanding and acceptance.   Dental advisory given  Plan Discussed with: Anesthesiologist  Anesthesia Plan Comments:         Anesthesia Quick Evaluation  

## 2016-01-07 ENCOUNTER — Ambulatory Visit: Payer: Self-pay

## 2016-01-07 LAB — CBC
HEMATOCRIT: 31.2 % — AB (ref 36.0–46.0)
HEMOGLOBIN: 11.3 g/dL — AB (ref 12.0–15.0)
MCH: 32.6 pg (ref 26.0–34.0)
MCHC: 36.2 g/dL — ABNORMAL HIGH (ref 30.0–36.0)
MCV: 89.9 fL (ref 78.0–100.0)
Platelets: 213 10*3/uL (ref 150–400)
RBC: 3.47 MIL/uL — AB (ref 3.87–5.11)
RDW: 14.9 % (ref 11.5–15.5)
WBC: 13.2 10*3/uL — AB (ref 4.0–10.5)

## 2016-01-07 MED ORDER — ACETAMINOPHEN 325 MG PO TABS: 650.0000 mg | ORAL_TABLET | Freq: Four times a day (QID) | ORAL | Status: DC | PRN

## 2016-01-07 MED ORDER — IBUPROFEN 600 MG PO TABS: 600.0000 mg | ORAL_TABLET | Freq: Four times a day (QID) | ORAL | Status: DC | PRN

## 2016-01-07 NOTE — Lactation Note (Signed)
This note was copied from a baby's chart. Lactation Consultation Note  Upon entering baby at the end of 15 min feeding.  Reminded mother to compress/breast to keep baby active. Mother denies problems or questions.  Mother has manual pump. Reviewed engorgement care and monitoring voids/stools.   Patient Name: Boy Darden DatesMonica Innes BJYNW'GToday's Date: 01/07/2016 Reason for consult: Follow-up assessment   Maternal Data    Feeding Feeding Type: Breast Fed Length of feed: 15 min  LATCH Score/Interventions Latch: Grasps breast easily, tongue down, lips flanged, rhythmical sucking. Intervention(s): Breast compression  Audible Swallowing: Spontaneous and intermittent Intervention(s): Skin to skin  Type of Nipple: Everted at rest and after stimulation  Comfort (Breast/Nipple): Soft / non-tender     Hold (Positioning): No assistance needed to correctly position infant at breast.  LATCH Score: 10  Lactation Tools Discussed/Used     Consult Status Consult Status: Complete    Hardie PulleyBerkelhammer, Shanetta Nicolls Boschen 01/07/2016, 7:14 PM

## 2016-01-07 NOTE — Anesthesia Postprocedure Evaluation (Signed)
Anesthesia Post Note  Patient: Catherine Price  Procedure(s) Performed: * No procedures listed *  Patient location during evaluation: Mother Baby Anesthesia Type: Epidural Level of consciousness: awake and alert, oriented and patient cooperative Pain management: pain level controlled Vital Signs Assessment: post-procedure vital signs reviewed and stable Respiratory status: spontaneous breathing Cardiovascular status: stable Postop Assessment: no headache, epidural receding, patient able to bend at knees and no signs of nausea or vomiting Anesthetic complications: no Comments: Denies pain.    Last Vitals:  Filed Vitals:   01/06/16 2130 01/07/16 0530  BP: 116/79 123/71  Pulse: 90 88  Temp: 36.7 C 36.4 C  Resp: 18 18    Last Pain:  Filed Vitals:   01/07/16 0556  PainSc: 0-No pain                 [facility] Ms State HospitalWRINKLE,[name]

## 2016-01-07 NOTE — Discharge Instructions (Signed)
No vaginal entry °No heavy lifting °

## 2016-01-07 NOTE — Discharge Summary (Signed)
Obstetric Discharge Summary Reason for Admission: onset of labor Prenatal Procedures: none Intrapartum Procedures: spontaneous vaginal delivery Postpartum Procedures: none Complications-Operative and Postpartum: none HEMOGLOBIN  Date Value Ref Range Status  01/07/2016 11.3* 12.0 - 15.0 g/dL Final   HCT  Date Value Ref Range Status  01/07/2016 31.2* 36.0 - 46.0 % Final    Physical Exam:  General: alert, cooperative and no distress Lochia: appropriate Uterine Fundus: firm Incision: healing well DVT Evaluation: No evidence of DVT seen on physical exam.  Discharge Diagnoses: Term Pregnancy-delivered  Discharge Information: Date: 01/07/2016 Activity: pelvic rest Diet: routine Medications: PNV and Ibuprofen Condition: stable Instructions: refer to practice specific booklet Discharge to: home Follow-up Information    Follow up In 5 weeks.      Newborn Data: Live born female  Birth Weight: 8 lb 2.7 oz (3705 g) APGAR: 8, 9  Home with mother.  Mak Bonny II,Delvonte Berenson E 01/07/2016, 9:36 AM

## 2016-01-07 NOTE — Progress Notes (Signed)
Discharge teaching reviewed through use of Baby and Me book, no questions or concerns, both parents state an understanding of when to call MD, and home care needs of mom and baby.

## 2016-01-07 NOTE — Progress Notes (Signed)
Post Partum Day 1 Subjective: no complaints, up ad lib, voiding and tolerating PO  Objective: Blood pressure 123/71, pulse 88, temperature 97.5 F (36.4 C), temperature source Oral, resp. rate 18, height 5\' 7"  (1.702 m), weight 207 lb 12.8 oz (94.257 kg), last menstrual period 03/24/2015, SpO2 98 %, unknown if currently breastfeeding.  Physical Exam:  General: alert, cooperative and no distress Lochia: appropriate Uterine Fundus: firm Incision: healing well DVT Evaluation: No evidence of DVT seen on physical exam.   Recent Labs  01/06/16 1750 01/07/16 0512  HGB 12.1 11.3*  HCT 34.8* 31.2*    Assessment/Plan: Discharge home   LOS: 1 day   Catherine Price,Catherine Price E 01/07/2016, 9:33 AM

## 2016-01-08 ENCOUNTER — Inpatient Hospital Stay (HOSPITAL_COMMUNITY): Admission: RE | Admit: 2016-01-08 | Payer: BLUE CROSS/BLUE SHIELD | Source: Ambulatory Visit

## 2016-02-01 ENCOUNTER — Telehealth (HOSPITAL_COMMUNITY): Payer: Self-pay

## 2016-09-16 DIAGNOSIS — D1800 Hemangioma unspecified site: Secondary | ICD-10-CM | POA: Diagnosis not present

## 2016-09-16 DIAGNOSIS — D229 Melanocytic nevi, unspecified: Secondary | ICD-10-CM | POA: Diagnosis not present

## 2016-09-16 DIAGNOSIS — Z6827 Body mass index (BMI) 27.0-27.9, adult: Secondary | ICD-10-CM | POA: Diagnosis not present

## 2016-10-21 DIAGNOSIS — Z6826 Body mass index (BMI) 26.0-26.9, adult: Secondary | ICD-10-CM | POA: Diagnosis not present

## 2016-10-21 DIAGNOSIS — N61 Mastitis without abscess: Secondary | ICD-10-CM | POA: Diagnosis not present

## 2016-12-04 DIAGNOSIS — Z13220 Encounter for screening for lipoid disorders: Secondary | ICD-10-CM | POA: Diagnosis not present

## 2016-12-04 DIAGNOSIS — Z0000 Encounter for general adult medical examination without abnormal findings: Secondary | ICD-10-CM | POA: Diagnosis not present

## 2017-01-20 DIAGNOSIS — Z0000 Encounter for general adult medical examination without abnormal findings: Secondary | ICD-10-CM | POA: Diagnosis not present

## 2017-01-20 DIAGNOSIS — Z6827 Body mass index (BMI) 27.0-27.9, adult: Secondary | ICD-10-CM | POA: Diagnosis not present

## 2018-04-22 DIAGNOSIS — G43909 Migraine, unspecified, not intractable, without status migrainosus: Secondary | ICD-10-CM | POA: Diagnosis not present

## 2018-04-22 DIAGNOSIS — Z6828 Body mass index (BMI) 28.0-28.9, adult: Secondary | ICD-10-CM | POA: Diagnosis not present

## 2018-04-24 DIAGNOSIS — Z13220 Encounter for screening for lipoid disorders: Secondary | ICD-10-CM | POA: Diagnosis not present

## 2018-04-24 DIAGNOSIS — Z1329 Encounter for screening for other suspected endocrine disorder: Secondary | ICD-10-CM | POA: Diagnosis not present

## 2018-04-24 DIAGNOSIS — Z0000 Encounter for general adult medical examination without abnormal findings: Secondary | ICD-10-CM | POA: Diagnosis not present

## 2018-04-24 DIAGNOSIS — Z114 Encounter for screening for human immunodeficiency virus [HIV]: Secondary | ICD-10-CM | POA: Diagnosis not present

## 2018-05-05 DIAGNOSIS — Z0000 Encounter for general adult medical examination without abnormal findings: Secondary | ICD-10-CM | POA: Diagnosis not present

## 2018-05-05 DIAGNOSIS — Z23 Encounter for immunization: Secondary | ICD-10-CM | POA: Diagnosis not present

## 2018-05-05 DIAGNOSIS — Z6828 Body mass index (BMI) 28.0-28.9, adult: Secondary | ICD-10-CM | POA: Diagnosis not present

## 2018-05-25 DIAGNOSIS — Z6828 Body mass index (BMI) 28.0-28.9, adult: Secondary | ICD-10-CM | POA: Diagnosis not present

## 2018-05-25 DIAGNOSIS — S90562A Insect bite (nonvenomous), left ankle, initial encounter: Secondary | ICD-10-CM | POA: Diagnosis not present

## 2018-06-02 DIAGNOSIS — Z6829 Body mass index (BMI) 29.0-29.9, adult: Secondary | ICD-10-CM | POA: Diagnosis not present

## 2018-06-02 DIAGNOSIS — Z01419 Encounter for gynecological examination (general) (routine) without abnormal findings: Secondary | ICD-10-CM | POA: Diagnosis not present

## 2018-11-04 DIAGNOSIS — Z23 Encounter for immunization: Secondary | ICD-10-CM | POA: Diagnosis not present

## 2018-12-14 DIAGNOSIS — G43829 Menstrual migraine, not intractable, without status migrainosus: Secondary | ICD-10-CM | POA: Diagnosis not present

## 2018-12-14 DIAGNOSIS — Z6829 Body mass index (BMI) 29.0-29.9, adult: Secondary | ICD-10-CM | POA: Diagnosis not present

## 2019-05-11 DIAGNOSIS — G43829 Menstrual migraine, not intractable, without status migrainosus: Secondary | ICD-10-CM | POA: Diagnosis not present

## 2019-05-11 DIAGNOSIS — Z3161 Procreative counseling and advice using natural family planning: Secondary | ICD-10-CM | POA: Diagnosis not present

## 2019-05-11 DIAGNOSIS — Z719 Counseling, unspecified: Secondary | ICD-10-CM | POA: Diagnosis not present

## 2019-07-20 DIAGNOSIS — Z6830 Body mass index (BMI) 30.0-30.9, adult: Secondary | ICD-10-CM | POA: Diagnosis not present

## 2019-07-20 DIAGNOSIS — Z01419 Encounter for gynecological examination (general) (routine) without abnormal findings: Secondary | ICD-10-CM | POA: Diagnosis not present

## 2019-07-23 DIAGNOSIS — Z1159 Encounter for screening for other viral diseases: Secondary | ICD-10-CM | POA: Diagnosis not present

## 2019-07-28 DIAGNOSIS — Z0000 Encounter for general adult medical examination without abnormal findings: Secondary | ICD-10-CM | POA: Diagnosis not present

## 2019-07-29 DIAGNOSIS — Z23 Encounter for immunization: Secondary | ICD-10-CM | POA: Diagnosis not present

## 2019-07-29 DIAGNOSIS — Z6829 Body mass index (BMI) 29.0-29.9, adult: Secondary | ICD-10-CM | POA: Diagnosis not present

## 2019-07-29 DIAGNOSIS — Z0000 Encounter for general adult medical examination without abnormal findings: Secondary | ICD-10-CM | POA: Diagnosis not present

## 2019-09-08 DIAGNOSIS — G4700 Insomnia, unspecified: Secondary | ICD-10-CM | POA: Diagnosis not present

## 2019-09-08 DIAGNOSIS — R208 Other disturbances of skin sensation: Secondary | ICD-10-CM | POA: Diagnosis not present

## 2019-09-08 DIAGNOSIS — F411 Generalized anxiety disorder: Secondary | ICD-10-CM | POA: Diagnosis not present

## 2019-09-15 DIAGNOSIS — G43909 Migraine, unspecified, not intractable, without status migrainosus: Secondary | ICD-10-CM | POA: Diagnosis not present

## 2019-09-16 ENCOUNTER — Other Ambulatory Visit: Payer: Self-pay | Admitting: Family Medicine

## 2019-09-16 DIAGNOSIS — R202 Paresthesia of skin: Secondary | ICD-10-CM

## 2019-09-23 DIAGNOSIS — G43909 Migraine, unspecified, not intractable, without status migrainosus: Secondary | ICD-10-CM | POA: Diagnosis not present

## 2019-09-23 DIAGNOSIS — G4700 Insomnia, unspecified: Secondary | ICD-10-CM | POA: Diagnosis not present

## 2019-09-23 DIAGNOSIS — R202 Paresthesia of skin: Secondary | ICD-10-CM | POA: Diagnosis not present

## 2019-09-24 ENCOUNTER — Other Ambulatory Visit: Payer: Self-pay

## 2019-09-24 ENCOUNTER — Ambulatory Visit
Admission: RE | Admit: 2019-09-24 | Discharge: 2019-09-24 | Disposition: A | Discharge status: Home / Self Care | Payer: BC Managed Care – PPO | Source: Ambulatory Visit | Attending: Family Medicine | Admitting: Family Medicine

## 2019-09-24 DIAGNOSIS — R202 Paresthesia of skin: Secondary | ICD-10-CM | POA: Diagnosis not present

## 2019-09-24 MED ORDER — GADOBENATE DIMEGLUMINE 529 MG/ML IV SOLN
17.0000 mL | Freq: Once | INTRAVENOUS | Status: AC | PRN
  Administered 2019-09-24: 17 mL via INTRAVENOUS

## 2019-09-30 DIAGNOSIS — F411 Generalized anxiety disorder: Secondary | ICD-10-CM | POA: Diagnosis not present

## 2019-09-30 DIAGNOSIS — G43909 Migraine, unspecified, not intractable, without status migrainosus: Secondary | ICD-10-CM | POA: Diagnosis not present

## 2019-09-30 DIAGNOSIS — R202 Paresthesia of skin: Secondary | ICD-10-CM | POA: Diagnosis not present

## 2019-10-04 DIAGNOSIS — Z20828 Contact with and (suspected) exposure to other viral communicable diseases: Secondary | ICD-10-CM | POA: Diagnosis not present

## 2019-10-04 DIAGNOSIS — Z03818 Encounter for observation for suspected exposure to other biological agents ruled out: Secondary | ICD-10-CM | POA: Diagnosis not present

## 2019-10-05 ENCOUNTER — Other Ambulatory Visit: Payer: BC Managed Care – PPO

## 2019-10-06 ENCOUNTER — Other Ambulatory Visit: Payer: BC Managed Care – PPO

## 2019-10-16 DIAGNOSIS — Z03818 Encounter for observation for suspected exposure to other biological agents ruled out: Secondary | ICD-10-CM | POA: Diagnosis not present

## 2019-10-16 DIAGNOSIS — Z20828 Contact with and (suspected) exposure to other viral communicable diseases: Secondary | ICD-10-CM | POA: Diagnosis not present

## 2019-10-18 ENCOUNTER — Other Ambulatory Visit: Payer: BC Managed Care – PPO

## 2020-02-24 IMAGING — MR MR HEAD WO/W CM
9 series · 48 of 48 positions shown · IV contrast (multihance)
Comparison: None.

CLINICAL DATA: Intermittent left-sided paresthesias over the last
year. Chronic headaches.

EXAM:
MRI HEAD WITHOUT AND WITH CONTRAST
TECHNIQUE: Multiplanar, multiecho pulse sequences of the brain and surrounding
structures were obtained without and with intravenous contrast.
CONTRAST:  17mL MULTIHANCE GADOBENATE DIMEGLUMINE 529 MG/ML IV SOLN

[Series 5: T1 · sagittal · 4.0mm · 0.75mm/px · 3 of 31 slices shown (1 of 3)]
[im 1/31]
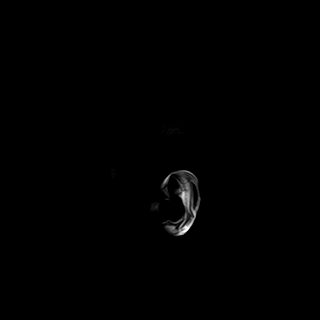
[im 16/31]
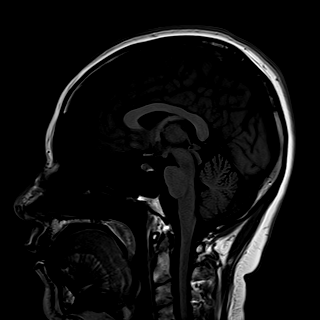
[im 31/31]
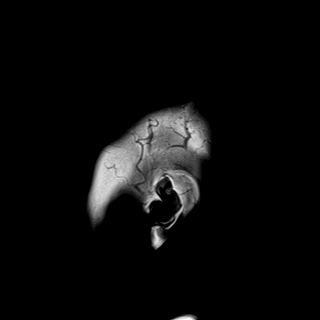

[Series 6: DWI · axial · 3.0mm · 1.44mm/px · z∈[-61,+81]mm · 7 of 88 slices shown (1 of 2)]
[im 1/88]
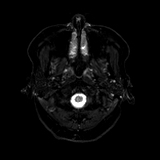
[im 15/88]
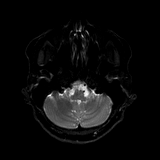
[im 30/88]
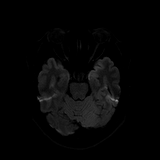
[im 44/88]
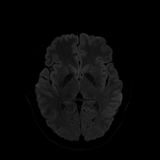
[im 59/88]
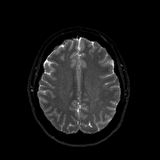
[im 73/88]
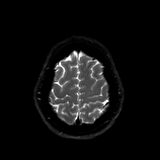
[im 88/88]
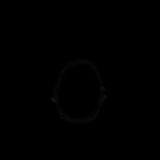

[Series 7: DWI · axial · 3.0mm · 1.44mm/px · z∈[-61,+81]mm · 3 of 43 slices shown (2 of 2)]
[im 1/43]
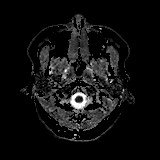
[im 22/43]
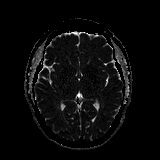
[im 43/43]
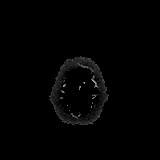

[Series 11: FLAIR · axial · 3.0mm · 0.72mm/px · z∈[-67,+83]mm · 2 of 26 slices shown]
[im 1/26]
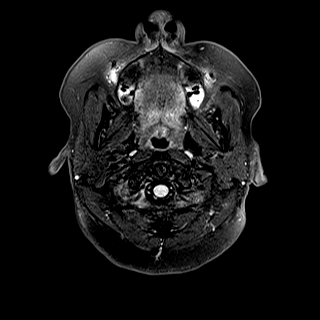
[im 26/26]
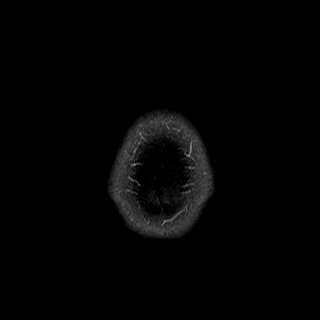

[Series 13: swi_images · axial · 1.5mm · 0.90mm/px · z∈[-64,+79]mm · 7 of 96 slices shown]
[im 1/96]
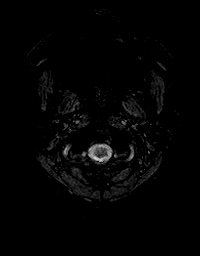
[im 16/96]
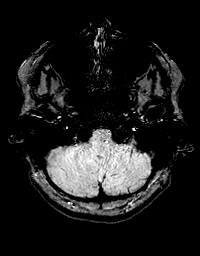
[im 32/96]
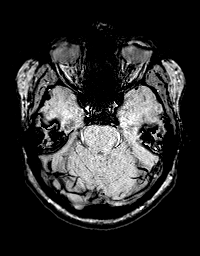
[im 48/96]
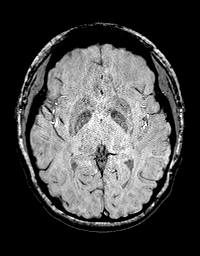
[im 64/96]
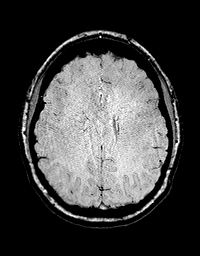
[im 80/96]
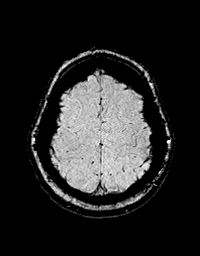
[im 96/96]
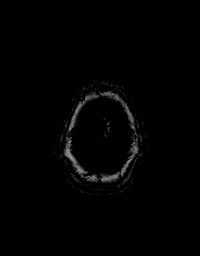

[Series 14: T1 · axial · 1.0mm · 0.94mm/px · z∈[-82,+76]mm · 11 of 160 slices shown (2 of 3)]
[im 1/160]
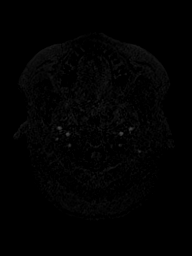
[im 16/160]
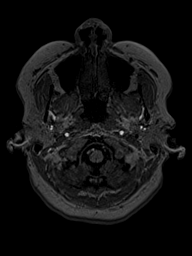
[im 32/160]
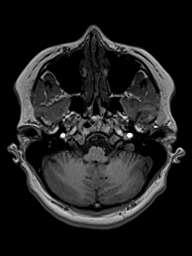
[im 48/160]
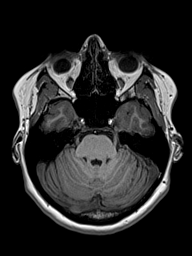
[im 64/160]
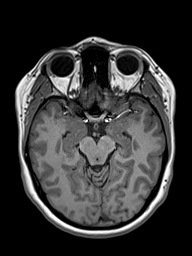
[im 80/160]
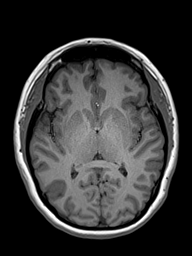
[im 96/160]
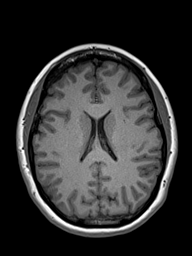
[im 112/160]
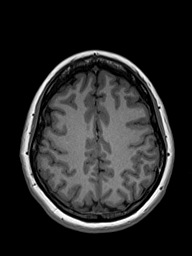
[im 128/160]
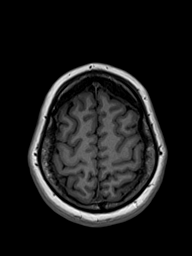
[im 144/160]
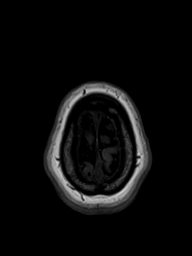
[im 160/160]
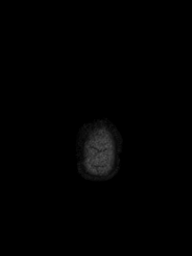

[Series 15: T2 post-contrast · coronal · 4.0mm · 0.36mm/px · 2 of 33 slices shown]
[im 1/33]
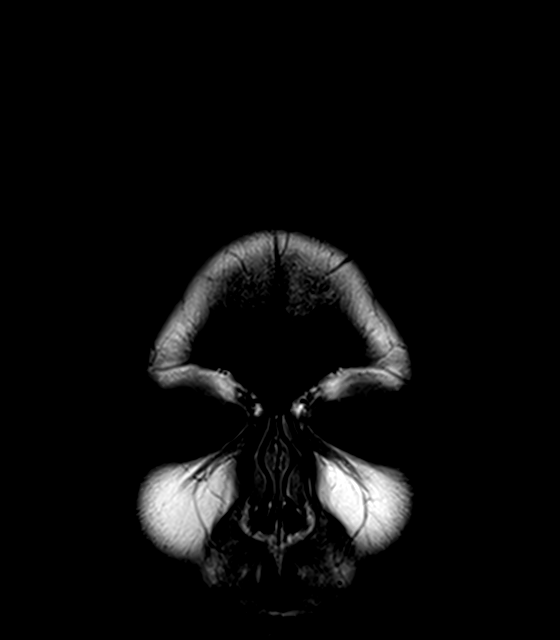
[im 33/33]
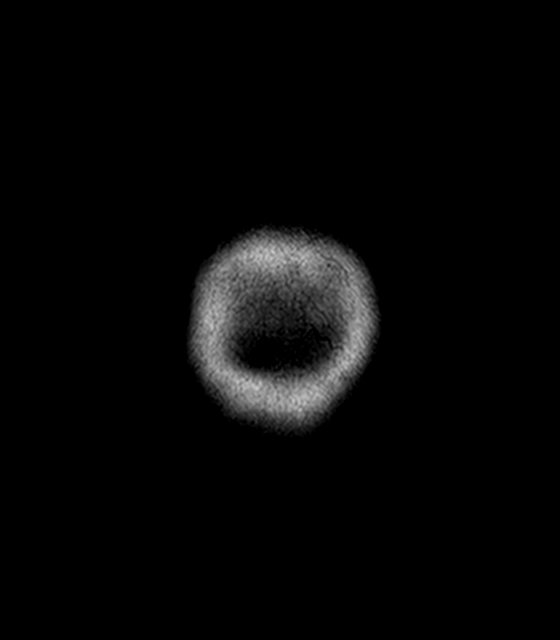

[Series 16: T1 · axial · 1.0mm · 0.94mm/px · z∈[-82,+76]mm · 11 of 160 slices shown (3 of 3)]
[im 1/160]
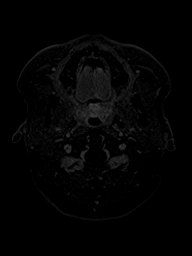
[im 16/160]
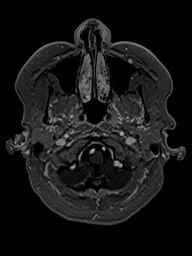
[im 32/160]
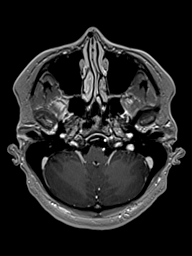
[im 48/160]
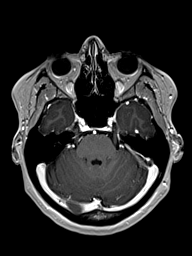
[im 64/160]
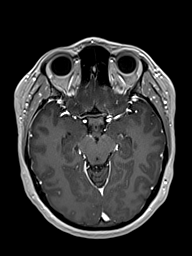
[im 80/160]
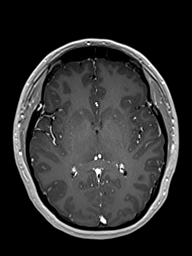
[im 96/160]
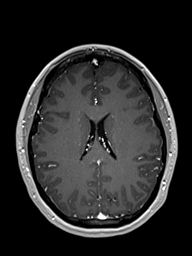
[im 112/160]
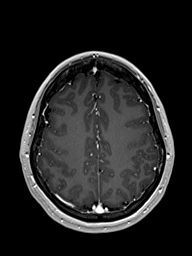
[im 128/160]
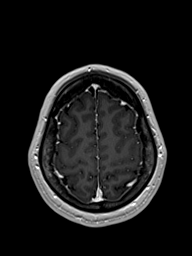
[im 144/160]
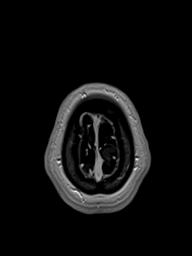
[im 160/160]
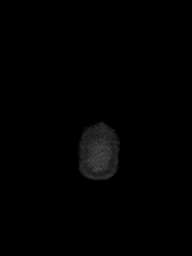

[Series 17: T1 post-contrast · coronal · 4.0mm · 0.72mm/px · 2 of 33 slices shown]
[im 1/33]
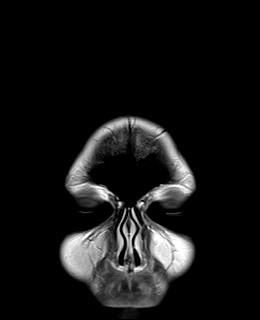
[im 33/33]
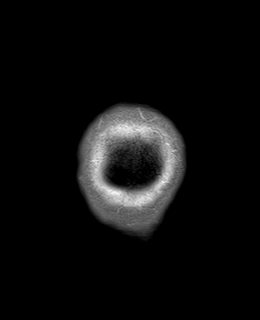

[48 of 48 positions shown; findings below may reference images not displayed]

FINDINGS: Brain: The brain has a normal appearance without evidence of
malformation, atrophy, old or acute small or large vessel
infarction, mass lesion, hemorrhage, hydrocephalus or extra-axial
collection. No sign of demyelinating disease. After contrast
administration, no abnormal enhancement occurs.

Vascular: Major vessels at the base of the brain show flow. Venous
sinuses appear patent.

Skull and upper cervical spine: Normal.

Sinuses/Orbits: Clear/normal.

Other: None significant.
IMPRESSION: Normal examination. No finding to explain the clinical presentation.

## 2020-02-29 DIAGNOSIS — D2262 Melanocytic nevi of left upper limb, including shoulder: Secondary | ICD-10-CM | POA: Diagnosis not present

## 2020-02-29 DIAGNOSIS — D485 Neoplasm of uncertain behavior of skin: Secondary | ICD-10-CM | POA: Diagnosis not present

## 2020-02-29 DIAGNOSIS — D2261 Melanocytic nevi of right upper limb, including shoulder: Secondary | ICD-10-CM | POA: Diagnosis not present

## 2020-02-29 DIAGNOSIS — L821 Other seborrheic keratosis: Secondary | ICD-10-CM | POA: Diagnosis not present

## 2020-04-24 DIAGNOSIS — G43909 Migraine, unspecified, not intractable, without status migrainosus: Secondary | ICD-10-CM | POA: Diagnosis not present

## 2020-04-24 DIAGNOSIS — F411 Generalized anxiety disorder: Secondary | ICD-10-CM | POA: Diagnosis not present

## 2020-04-24 DIAGNOSIS — R200 Anesthesia of skin: Secondary | ICD-10-CM | POA: Diagnosis not present

## 2020-04-27 DIAGNOSIS — M542 Cervicalgia: Secondary | ICD-10-CM | POA: Diagnosis not present

## 2020-04-27 DIAGNOSIS — F411 Generalized anxiety disorder: Secondary | ICD-10-CM | POA: Diagnosis not present

## 2020-05-05 DIAGNOSIS — Z2821 Immunization not carried out because of patient refusal: Secondary | ICD-10-CM | POA: Diagnosis not present

## 2020-05-05 DIAGNOSIS — F411 Generalized anxiety disorder: Secondary | ICD-10-CM | POA: Diagnosis not present

## 2020-05-05 DIAGNOSIS — R202 Paresthesia of skin: Secondary | ICD-10-CM | POA: Diagnosis not present

## 2020-05-05 DIAGNOSIS — R519 Headache, unspecified: Secondary | ICD-10-CM | POA: Diagnosis not present

## 2020-05-09 DIAGNOSIS — Z20822 Contact with and (suspected) exposure to covid-19: Secondary | ICD-10-CM | POA: Diagnosis not present

## 2020-05-27 DIAGNOSIS — Z20828 Contact with and (suspected) exposure to other viral communicable diseases: Secondary | ICD-10-CM | POA: Diagnosis not present

## 2020-07-14 DIAGNOSIS — R202 Paresthesia of skin: Secondary | ICD-10-CM | POA: Diagnosis not present

## 2020-07-14 DIAGNOSIS — G43909 Migraine, unspecified, not intractable, without status migrainosus: Secondary | ICD-10-CM | POA: Diagnosis not present

## 2020-07-18 DIAGNOSIS — R202 Paresthesia of skin: Secondary | ICD-10-CM | POA: Diagnosis not present

## 2020-07-18 DIAGNOSIS — G43909 Migraine, unspecified, not intractable, without status migrainosus: Secondary | ICD-10-CM | POA: Diagnosis not present

## 2020-09-20 ENCOUNTER — Encounter: Payer: Self-pay | Admitting: *Deleted

## 2020-09-25 ENCOUNTER — Encounter: Payer: Self-pay | Admitting: Diagnostic Neuroimaging

## 2020-09-25 ENCOUNTER — Ambulatory Visit: Payer: BC Managed Care – PPO | Admitting: Diagnostic Neuroimaging

## 2020-09-25 VITALS — BP 132/83 | HR 78 | Ht 66.0 in | Wt 199.0 lb

## 2020-09-25 DIAGNOSIS — G43109 Migraine with aura, not intractable, without status migrainosus: Secondary | ICD-10-CM | POA: Diagnosis not present

## 2020-09-25 NOTE — Patient Instructions (Signed)
  MIGRAINE PHENOMENON (intermittent left face, arm, leg numbness; improving) - monitor migraine triggers - continue rizatriptan as needed  To prevent or relieve headaches, try the following:   Cool Compress. Lie down and place a cool compress on your head.   Avoid headache triggers. If certain foods or odors seem to have triggered your migraines in the past, avoid them. A headache diary might help you identify triggers.   Include physical activity in your daily routine.   Manage stress. Find healthy ways to cope with the stressors, such as delegating tasks on your to-do list.   Practice relaxation techniques. Try deep breathing, yoga, massage and visualization.   Eat regularly. Eating regularly scheduled meals and maintaining a healthy diet might help prevent headaches. Also, drink plenty of fluids.   Follow a regular sleep schedule. Sleep deprivation might contribute to headaches  Consider biofeedback. With this mind-body technique, you learn to control certain bodily functions -- such as muscle tension, heart rate and blood pressure -- to prevent headaches or reduce headache pain.

## 2020-09-25 NOTE — Progress Notes (Signed)
[name]  PATIENT: Catherine Price DOB: 1981-10-10  REFERRING CLINICIAN: Lewis Moccasin, MD HISTORY FROM: PATIENT  REASON FOR VISIT: NEW CONSULT    HISTORICAL  CHIEF COMPLAINT:  Chief Complaint  Patient presents with   Left sided paresthesias    Rm 7 New Pt "over past year- left limbs feel tingly and numb, has increased in frequency in last 6 months, left grip not as strong, had a few instances of left face numbness; notice it when running"      HISTORY OF PRESENT ILLNESS:   39 year old female here for evaluation of numbness and tingling.  Patient reports intermittent numbness and tingling in left arm and left leg.  Symptoms worse at night.  Patient has noticed this since 2020.  Symptoms can last hours at a time, 10-15 times per month or more. Symptoms are intensified when she is active such as running.  About 6 months ago she noticed some tingling sensation in the left face.  However the last 1 month it has slightly improved.  B12 394, TSH 1.87.  MRI brain normal. No other associated factors.  Patient has history of migraines since her 66s with severe unilateral throbbing headaches with photophobia, osmophobia and visual aura.  Headaches can last hours at a time.  Headaches have been better lately; 1-2 per month.    REVIEW OF SYSTEMS: Full 14 system review of systems performed and negative with exception of: As per HPI.  ALLERGIES: No Known Allergies  HOME MEDICATIONS: Outpatient Medications Prior to Visit  Medication Sig Dispense Refill   acetaminophen (TYLENOL) 325 MG tablet Take 2 tablets (650 mg total) by mouth every 6 (six) hours as needed (for pain scale < 4). 60 tablet 0   rizatriptan (MAXALT-MLT) 10 MG disintegrating tablet as needed.     SUMAtriptan (IMITREX) 100 MG tablet Take 100 mg by mouth 2 (two) times daily as needed. (Patient not taking: Reported on 09/25/2020)     ibuprofen (ADVIL,MOTRIN) 600 MG tablet Take 1 tablet (600 mg  total) by mouth every 6 (six) hours as needed. 30 tablet 0   Prenatal Vit-Fe Fumarate-FA (PRENATAL MULTIVITAMIN) TABS tablet Take 1 tablet by mouth daily at 12 noon.     No facility-administered medications prior to visit.    PAST MEDICAL HISTORY: Past Medical History:  Diagnosis Date   Abnormal Pap smear    GAD (generalized anxiety disorder)    Migraine headache    Paresthesia    UTI (urinary tract infection)    Vaginal Pap smear, abnormal    Vitamin D deficiency     PAST SURGICAL HISTORY: Past Surgical History:  Procedure Laterality Date   COLPOSCOPY     RHINOPLASTY  2002   wisdom teeth      FAMILY HISTORY: Family History  Problem Relation Age of Onset   Diabetes type II Maternal Aunt    Diabetes Maternal Aunt    Hypertension Mother    Hyperlipidemia Mother    Heart attack Mother    Diabetes Mother    Hypertension Father    Hyperlipidemia Father    Hypertension Sister    Alcoholism Brother     SOCIAL HISTORY: Social History   Socioeconomic History   Marital status: Married    Spouse name: Maisie Fus   Number of children: 2   Years of education: Not on file   Highest education level: Bachelor's degree (e.g., BA, AB, BS)  Occupational History   Not on file  Tobacco Use  Smoking status: Never Smoker   Smokeless tobacco: Never Used  Substance and Sexual Activity   Alcohol use: No   Drug use: No   Sexual activity: Yes  Other Topics Concern   Not on file  Social History Narrative   Work or School: lab in [employer]      Home Situation: lives with husband and 2 chldren      Spiritual Beliefs: Christian      Lifestyle: no regular exercise; diet is good         Caffeine- coffee 2-3 x week   Social Determinants of Corporate investment banker Strain: Not on file  Food Insecurity: Not on file  Transportation Needs: Not on file  Physical Activity: Not on file  Stress: Not on file  Social Connections: Not on file   Intimate Partner Violence: Not on file     PHYSICAL EXAM  GENERAL EXAM/CONSTITUTIONAL: Vitals:  Vitals:   09/25/20 0814  BP: 132/83  Pulse: 78  Weight: 199 lb (90.3 kg)  Height: 5\' 6"  (1.676 m)     Body mass index is 32.12 kg/m. Wt Readings from Last 3 Encounters:  09/25/20 199 lb (90.3 kg)  01/06/16 207 lb 12.8 oz (94.3 kg)  12/27/13 164 lb (74.4 kg)     Patient is in no distress; well developed, nourished and groomed; neck is supple  CARDIOVASCULAR:  Examination of carotid arteries is normal; no carotid bruits  Regular rate and rhythm, no murmurs  Examination of peripheral vascular system by observation and palpation is normal  EYES:  Ophthalmoscopic exam of optic discs and posterior segments is normal; no papilledema or hemorrhages  No exam data present  MUSCULOSKELETAL:  Gait, strength, tone, movements noted in Neurologic exam below  NEUROLOGIC: MENTAL STATUS:  No flowsheet data found.  awake, alert, oriented to person, place and time  recent and remote memory intact  normal attention and concentration  language fluent, comprehension intact, naming intact  fund of knowledge appropriate  CRANIAL NERVE:   2nd - no papilledema on fundoscopic exam  2nd, 3rd, 4th, 6th - pupils equal and reactive to light, visual fields full to confrontation, extraocular muscles intact, no nystagmus  5th - facial sensation symmetric  7th - facial strength symmetric  8th - hearing intact  9th - palate elevates symmetrically, uvula midline  11th - shoulder shrug symmetric  12th - tongue protrusion midline  MOTOR:   normal bulk and tone, full strength in the BUE, BLE  SENSORY:   normal and symmetric to light touch, temperature, vibration  COORDINATION:   finger-nose-finger, fine finger movements normal  REFLEXES:   deep tendon reflexes present and symmetric  GAIT/STATION:   narrow based gait     DIAGNOSTIC DATA (LABS, IMAGING,  TESTING) - I reviewed patient records, labs, notes, testing and imaging myself where available.  Lab Results  Component Value Date   WBC 13.2 (H) 01/07/2016   HGB 11.3 (L) 01/07/2016   HCT 31.2 (L) 01/07/2016   MCV 89.9 01/07/2016   PLT 213 01/07/2016      Component Value Date/Time   NA 137 01/06/2016 1750   K 3.6 01/06/2016 1750   CL 105 01/06/2016 1750   CO2 24 01/06/2016 1750   GLUCOSE 131 (H) 01/06/2016 1750   BUN 9 01/06/2016 1750   CREATININE 0.57 01/06/2016 1750   CALCIUM 8.7 (L) 01/06/2016 1750   PROT 6.1 (L) 01/06/2016 1750   ALBUMIN 2.7 (L) 01/06/2016 1750   AST 23 01/06/2016  1750   ALT 16 01/06/2016 1750   ALKPHOS 135 (H) 01/06/2016 1750   BILITOT 0.3 01/06/2016 1750   GFRNONAA >60 01/06/2016 1750   GFRAA >60 01/06/2016 1750   Lab Results  Component Value Date   CHOL 138 12/27/2013   HDL 44.30 12/27/2013   LDLCALC 78 12/27/2013   TRIG 77.0 12/27/2013   CHOLHDL 3 12/27/2013   No results found for: HGBA1C No results found for: VITAMINB12 No results found for: TSH   09/23/20 MRI brain [I reviewed images myself and agree with interpretation. -[facility]]  - Normal examination. No finding to explain the clinical presentation.    ASSESSMENT AND PLAN  39 y.o. year old female here with intermittent left face, arm and leg numbness, improving, could represent migraine phenomena.  Also with history of migraine with aura, stable and well-controlled with rizatriptan as needed.  Dx:  1. Migraine with aura and without status migrainosus, not intractable     PLAN:  MIGRAINE PHENOMENON (intermittent left face, arm, leg numbness; improving) - monitor migraine triggers - continue rizatriptan as needed  Return for pending if symptoms worsen or fail to improve.    Suanne Marker, MD 09/25/2020, 8:58 AM Certified in Neurology, Neurophysiology and Neuroimaging  [facility] [address] [place], [place] [address] [phone]

## 2020-09-26 ENCOUNTER — Encounter: Payer: Self-pay | Admitting: Diagnostic Neuroimaging

## 2020-10-12 DIAGNOSIS — U071 COVID-19: Secondary | ICD-10-CM | POA: Diagnosis not present

## 2020-10-12 DIAGNOSIS — J069 Acute upper respiratory infection, unspecified: Secondary | ICD-10-CM | POA: Diagnosis not present

## 2020-10-12 DIAGNOSIS — H6692 Otitis media, unspecified, left ear: Secondary | ICD-10-CM | POA: Diagnosis not present

## 2020-10-12 DIAGNOSIS — H6590 Unspecified nonsuppurative otitis media, unspecified ear: Secondary | ICD-10-CM | POA: Diagnosis not present

## 2021-05-28 DIAGNOSIS — M25512 Pain in left shoulder: Secondary | ICD-10-CM | POA: Diagnosis not present

## 2021-06-05 DIAGNOSIS — M25512 Pain in left shoulder: Secondary | ICD-10-CM | POA: Diagnosis not present

## 2021-06-14 DIAGNOSIS — M25512 Pain in left shoulder: Secondary | ICD-10-CM | POA: Diagnosis not present

## 2021-07-19 DIAGNOSIS — Z6832 Body mass index (BMI) 32.0-32.9, adult: Secondary | ICD-10-CM | POA: Diagnosis not present

## 2021-07-19 DIAGNOSIS — Z01419 Encounter for gynecological examination (general) (routine) without abnormal findings: Secondary | ICD-10-CM | POA: Diagnosis not present

## 2021-07-27 DIAGNOSIS — F411 Generalized anxiety disorder: Secondary | ICD-10-CM | POA: Diagnosis not present

## 2021-07-27 DIAGNOSIS — R519 Headache, unspecified: Secondary | ICD-10-CM | POA: Diagnosis not present

## 2021-07-27 DIAGNOSIS — G43909 Migraine, unspecified, not intractable, without status migrainosus: Secondary | ICD-10-CM | POA: Diagnosis not present

## 2021-07-27 DIAGNOSIS — G44219 Episodic tension-type headache, not intractable: Secondary | ICD-10-CM | POA: Diagnosis not present

## 2021-10-25 DIAGNOSIS — Z0000 Encounter for general adult medical examination without abnormal findings: Secondary | ICD-10-CM | POA: Diagnosis not present

## 2021-11-26 DIAGNOSIS — H6123 Impacted cerumen, bilateral: Secondary | ICD-10-CM | POA: Diagnosis not present

## 2021-11-26 DIAGNOSIS — Z1159 Encounter for screening for other viral diseases: Secondary | ICD-10-CM | POA: Diagnosis not present

## 2021-11-26 DIAGNOSIS — Z7189 Other specified counseling: Secondary | ICD-10-CM | POA: Diagnosis not present

## 2021-11-26 DIAGNOSIS — Z23 Encounter for immunization: Secondary | ICD-10-CM | POA: Diagnosis not present

## 2021-11-26 DIAGNOSIS — H6122 Impacted cerumen, left ear: Secondary | ICD-10-CM | POA: Diagnosis not present

## 2021-11-26 DIAGNOSIS — H6121 Impacted cerumen, right ear: Secondary | ICD-10-CM | POA: Diagnosis not present

## 2021-11-26 DIAGNOSIS — Z0000 Encounter for general adult medical examination without abnormal findings: Secondary | ICD-10-CM | POA: Diagnosis not present

## 2021-11-26 DIAGNOSIS — Z2821 Immunization not carried out because of patient refusal: Secondary | ICD-10-CM | POA: Diagnosis not present

## 2021-12-06 DIAGNOSIS — G43711 Chronic migraine without aura, intractable, with status migrainosus: Secondary | ICD-10-CM | POA: Diagnosis not present

## 2021-12-06 DIAGNOSIS — R519 Headache, unspecified: Secondary | ICD-10-CM | POA: Diagnosis not present

## 2021-12-28 DIAGNOSIS — N644 Mastodynia: Secondary | ICD-10-CM | POA: Diagnosis not present

## 2022-01-08 DIAGNOSIS — J020 Streptococcal pharyngitis: Secondary | ICD-10-CM | POA: Diagnosis not present

## 2022-02-02 ENCOUNTER — Encounter (HOSPITAL_BASED_OUTPATIENT_CLINIC_OR_DEPARTMENT_OTHER): Payer: Self-pay

## 2022-02-02 ENCOUNTER — Other Ambulatory Visit: Payer: Self-pay

## 2022-02-02 ENCOUNTER — Emergency Department (HOSPITAL_BASED_OUTPATIENT_CLINIC_OR_DEPARTMENT_OTHER)
Admission: EM | Admit: 2022-02-02 | Discharge: 2022-02-02 | Disposition: A | Discharge status: Home / Self Care | Payer: BC Managed Care – PPO | Attending: Emergency Medicine | Admitting: Emergency Medicine

## 2022-02-02 DIAGNOSIS — G43909 Migraine, unspecified, not intractable, without status migrainosus: Secondary | ICD-10-CM | POA: Diagnosis not present

## 2022-02-02 DIAGNOSIS — G43809 Other migraine, not intractable, without status migrainosus: Secondary | ICD-10-CM | POA: Insufficient documentation

## 2022-02-02 MED ORDER — KETOROLAC TROMETHAMINE 15 MG/ML IJ SOLN
15.0000 mg | Freq: Once | INTRAMUSCULAR | Status: AC
  Administered 2022-02-02: 15 mg via INTRAVENOUS
  Filled 2022-02-02: qty 1

## 2022-02-02 MED ORDER — DIPHENHYDRAMINE HCL 50 MG/ML IJ SOLN
25.0000 mg | Freq: Once | INTRAMUSCULAR | Status: AC
  Administered 2022-02-02: 25 mg via INTRAVENOUS
  Filled 2022-02-02: qty 1

## 2022-02-02 MED ORDER — METOCLOPRAMIDE HCL 10 MG PO TABS: 10.0000 mg | ORAL_TABLET | Freq: Three times a day (TID) | ORAL | 0 refills | Status: DC | PRN

## 2022-02-02 MED ORDER — DEXAMETHASONE SODIUM PHOSPHATE 10 MG/ML IJ SOLN
10.0000 mg | Freq: Once | INTRAMUSCULAR | Status: AC
  Administered 2022-02-02: 10 mg via INTRAVENOUS
  Filled 2022-02-02: qty 1

## 2022-02-02 MED ORDER — METOCLOPRAMIDE HCL 5 MG/ML IJ SOLN
10.0000 mg | Freq: Once | INTRAMUSCULAR | Status: AC
  Administered 2022-02-02: 10 mg via INTRAVENOUS
  Filled 2022-02-02: qty 2

## 2022-02-02 NOTE — ED Provider Notes (Signed)
?[facility] ?Provider Note ? ? ?CSN: [id number] ?Arrival date & time: 02/02/22  4098 ? ?  ? ?History ? ?Chief Complaint  ?Patient presents with  ? Migraine  ? ? ?Catherine Price is a 41 y.o. female with a history of chronic, complex migraines with associated paresthesias presented to emergency department with a headache.  Patient reports she began to develop migraine type symptoms 4 days ago on Tuesday.  She had an occipital headache which responded favorably to her imitrex at home (completely went away), but the headache subsequently returned on Thursday.  It has been waxing and waning since then.  She has taken multiple doses of her abortive migraine medications with minimal relief.  She reports the headache was most intense last night, and felt it in the left side of her temple, with associated paresthesias in the left half of her face.  She says it is similar to prior episode she has had in the past.  She was seen by a neurologist because of these paresthesias and had an outpatient work-up, including MRI of the brain, which did not show sign of stroke or MS (MRI in Dec 2020, external chart reviewed).  MRI was normal. ? ?Seen by Dr Joycelyn Schmid Dec 2021 and diagnosed with migraines. ? ?HPI ? ?  ? ?Home Medications ?Prior to Admission medications   ?Medication Sig Start Date End Date Taking? Authorizing Provider  ?metoCLOPramide (REGLAN) 10 MG tablet Take 1 tablet (10 mg total) by mouth every 8 (eight) hours as needed for up to 6 doses for nausea. 02/02/22  Yes Kaye Luoma, Kermit Balo, MD  ?acetaminophen (TYLENOL) 325 MG tablet Take 2 tablets (650 mg total) by mouth every 6 (six) hours as needed (for pain scale < 4). 01/07/16   Harold Hedge, MD  ?rizatriptan (MAXALT-MLT) 10 MG disintegrating tablet as needed. 07/12/20   [provider]  ?SUMAtriptan (IMITREX) 100 MG tablet Take 100 mg by mouth 2 (two) times daily as needed. ?Patient not taking: Reported on 09/25/2020 07/10/20    [provider]  ?   ? ?Allergies    ?Patient has no known allergies.   ? ?Review of Systems   ?Review of Systems ? ?Physical Exam ?Updated Vital Signs ?BP (!) 135/93 (BP Location: Right Arm)   Pulse 74   Temp 98.5 ?F (36.9 ?C)   Resp 16   LMP 01/28/2022 (Exact Date)   SpO2 99%  ?Physical Exam ?Constitutional:   ?   General: She is not in acute distress. ?HENT:  ?   Head: Normocephalic and atraumatic.  ?Eyes:  ?   Conjunctiva/sclera: Conjunctivae normal.  ?   Pupils: Pupils are equal, round, and reactive to light.  ?Cardiovascular:  ?   Rate and Rhythm: Normal rate and regular rhythm.  ?Pulmonary:  ?   Effort: Pulmonary effort is normal. No respiratory distress.  ?Abdominal:  ?   General: There is no distension.  ?   Tenderness: There is no abdominal tenderness.  ?Musculoskeletal:  ?   Cervical back: Neck supple. No rigidity.  ?Skin: ?   General: Skin is warm and dry.  ?Neurological:  ?   General: No focal deficit present.  ?   Mental Status: She is alert and oriented to person, place, and time. Mental status is at baseline.  ?   Comments: Praesthesias left half face ?No facial droop, speech is clear, no other focal neuro deficits noted on exam  ?Psychiatric:     ?   Mood  and Affect: Mood normal.     ?   Behavior: Behavior normal.  ? ? ?ED Results / Procedures / Treatments   ?Labs ?(all labs ordered are listed, but only abnormal results are displayed) ?Labs Reviewed - No data to display ? ?EKG ?None ? ?Radiology ?No results found. ? ?Procedures ?Procedures  ? ? ?Medications Ordered in ED ?Medications  ?ketorolac (TORADOL) 15 MG/ML injection 15 mg (15 mg Intravenous Given 02/02/22 0906)  ?metoCLOPramide (REGLAN) injection 10 mg (10 mg Intravenous Given 02/02/22 0906)  ?dexamethasone (DECADRON) injection 10 mg (10 mg Intravenous Given 02/02/22 0906)  ?diphenhydrAMINE (BENADRYL) injection 25 mg (25 mg Intravenous Given 02/02/22 0907)  ? ? ?ED Course/ Medical Decision Making/ A&P ?Clinical Course as of  02/02/22 1532  ?Sat Feb 02, 2022  ?1015 Migraine headache is significantly improved after medications.  Will reassess [MT]  ?  ?Clinical Course User Index ?[MT] Terald Sleeper, MD  ? ?                        ?Medical Decision Making ?Risk ?Prescription drug management. ? ? ?This patient presents to the Emergency Department with complaint of headache.  This involves an extensive number of treatment options, and is a complaint that carries with it a high risk of complications and morbidity.  The differential diagnosis for headache includes tension type headache vs occipital headache vs migraine vs sinusitis vs intracranial bleed vs other ? ?Given her clinical history, I suspect this presentation is most likely consistent with a complex migraine.  I have ordered IV migraine medications.  I do not believe we need emergent blood work or neuroimaging at this time.  I reviewed her external records including her neurology office evaluation the MRI of her brain, the imaging which was unremarkable, and have a lower suspicion at this time for new onset of multiple sclerosis.  Likewise I have a low suspicion for meningitis, given that she is afebrile, has no nuchal rigidity, has waxing and waning symptoms that respond favorably to her migraine abortive medications at home. ? ?Additional history was obtained from patient's husband at bedside ? ? ?After the interventions stated above, I reevaluated the patient and found that the patient remained clinically stable. ? ?Based on the patient's clinical exam, vital signs, risk factors, and ED testing, I felt that the patient's overall risk of life-threatening emergency such as ICH, meningitis, intracranial mass or tumor was quite low. ? ?I suspect this clinical presentation is most consistent with migraine, but explained to the patient that this evaluation was not a definitive diagnostic workup.  I discussed outpatient follow up with primary care provider, and provided specialist  office number on the patient's discharge paper if a referral was deemed necessary.  I discussed return precautions with the patient. I felt the patient was clinically stable for discharge. ? ? ? ? ? ? ? ? ?Final Clinical Impression(s) / ED Diagnoses ?Final diagnoses:  ?Other migraine without status migrainosus, not intractable  ? ? ?Rx / DC Orders ?ED Discharge Orders   ? ?      Ordered  ?  metoCLOPramide (REGLAN) 10 MG tablet  Every 8 hours PRN       ? 02/02/22 1144  ? ?  ?  ? ?  ? ? ?  ?Terald Sleeper, MD ?02/02/22 1532 ? ?

## 2022-02-02 NOTE — Discharge Instructions (Addendum)
For the next 2 days you can take ibuprofen combined with Benadryl and Reglan as needed for persistent headache and nausea.  This is most effective at night to help you sleep. ?

## 2022-02-21 ENCOUNTER — Ambulatory Visit (INDEPENDENT_AMBULATORY_CARE_PROVIDER_SITE_OTHER): Payer: BC Managed Care – PPO | Admitting: Neurology

## 2022-02-21 ENCOUNTER — Encounter: Payer: Self-pay | Admitting: Neurology

## 2022-02-21 VITALS — BP 124/78 | HR 85 | Ht 66.0 in | Wt 199.8 lb

## 2022-02-21 DIAGNOSIS — G43109 Migraine with aura, not intractable, without status migrainosus: Secondary | ICD-10-CM | POA: Diagnosis not present

## 2022-02-21 DIAGNOSIS — R5383 Other fatigue: Secondary | ICD-10-CM | POA: Diagnosis not present

## 2022-02-21 MED ORDER — UBRELVY 100 MG PO TABS: 100.0000 mg | ORAL_TABLET | ORAL | 0 refills | Status: DC | PRN

## 2022-02-21 MED ORDER — NURTEC 75 MG PO TBDP: 75.0000 mg | ORAL_TABLET | Freq: Every day | ORAL | 6 refills | Status: DC | PRN

## 2022-02-21 MED ORDER — NARATRIPTAN HCL 2.5 MG PO TABS: 2.5000 mg | ORAL_TABLET | ORAL | 11 refills | Status: DC | PRN

## 2022-02-21 NOTE — Progress Notes (Signed)
?[name] ? ? ? ?Provider:  Dr Lucia Gaskins ?Requesting Provider: No ref. provider found ?Primary Care Provider:  Lewis Moccasin, MD ? ?CC:  migraines ? ?HPI:  Catherine Price is a 41 y.o. female here as requested by No ref. provider found for migraines. Started in her 34s. Sister had migraines. Her migraines are once a month and can last 3-4 months around her menses but can happen at other times. Menstrually related migraines, but can get them with weather changes, perfumes, smells, onions, red wine. She may get tingling but not consistent and not before the migraine, never associated with migraines, no aura. She tried nurtec samples and it helps.She can have 4-6 migraine days a month but less than 10 total headache days a month. Had been to the ED, significantly affecting quality of life, she woke up with severe migraine, migraines are pulsating/pounding/throbbing, light/smell sensitivity, nausea, vomiting, hrts to move, moderate to severe in pain, last 4-72 hours. Ongoing at this freq and severity for years. No red flags like not positional or exertional, no vision changes. She had a migraine cocktail and felt better in the ED, nurtec works great for her.  ? ?Reviewed notes, labs and imaging from outside physicians, which showed: ? ?09/24/2019: FINDINGS: ?Brain: The brain has a normal appearance without evidence of ?malformation, atrophy, old or acute small or large vessel ?infarction, mass lesion, hemorrhage, hydrocephalus or extra-axial ?collection. No sign of demyelinating disease. After contrast ?administration, no abnormal enhancement occurs. ?  ?Vascular: Major vessels at the base of the brain show flow. Venous ?sinuses appear patent. ?  ?Skull and upper cervical spine: Normal. ?  ?Sinuses/Orbits: Clear/normal. ?  ?Other: None significant. ?  ?IMPRESSION: ?Normal examination. No finding to explain the clinical presentation. ?  ? ?Tried sumatriptan for 10-15 years stopped working,  Rizatriptan 2 years stopped working, tried [employer] ineffective, on amerge now, ondansetron, topiramate(side effects mental confusion), nortriptyline (caused sedation), tried propanolol (hypotension). Ondansetron, nurtec, tylenol, stadol, benadryl, advil, oxy,reglan ? ?Review of Systems: ?Patient complains of symptoms per HPI as well as the following symptoms migraies. Pertinent negatives and positives per HPI. All others negative. ? ? ?Social History  ? ?Socioeconomic History  ? Marital status: Married  ?  Spouse name: Maisie Fus  ? Number of children: 2  ? Years of education: Not on file  ? Highest education level: Bachelor's degree (e.g., BA, AB, BS)  ?Occupational History  ? Not on file  ?Tobacco Use  ? Smoking status: Never  ? Smokeless tobacco: Never  ?Vaping Use  ? Vaping Use: Never used  ?Substance and Sexual Activity  ? Alcohol use: No  ? Drug use: No  ? Sexual activity: Yes  ?Other Topics Concern  ? Not on file  ?Social History Narrative  ? Work or School: lab in [employer]  ?   ? Home Situation: lives with husband and 2 chldren  ?   ? Spiritual Beliefs: Christian  ?   ? Lifestyle: no regular exercise; diet is good  ?   ?   ? Caffeine- coffee 2-3 x week  ? ?Social Determinants of Health  ? ?Financial Resource Strain: Not on file  ?Food Insecurity: Not on file  ?Transportation Needs: Not on file  ?Physical Activity: Not on file  ?Stress: Not on file  ?Social Connections: Not on file  ?Intimate Partner Violence: Not on file  ? ? ?Family History  ?Problem Relation Age of Onset  ? Hypertension Mother   ? Hyperlipidemia Mother   ?  Heart attack Mother   ? Diabetes Mother   ? Hypertension Father   ? Hyperlipidemia Father   ? Migraines Sister   ? Hypertension Sister   ? Alcoholism Brother   ? Diabetes type II Maternal Aunt   ? Diabetes Maternal Aunt   ? ? ?Past Medical History:  ?Diagnosis Date  ? Abnormal Pap smear   ? GAD (generalized anxiety disorder)   ? Migraine headache   ? Paresthesia   ? UTI  (urinary tract infection)   ? Vaginal Pap smear, abnormal   ? Vitamin D deficiency   ? ? ?Patient Active Problem List  ? Diagnosis Date Noted  ? Normal labor and delivery 01/06/2016  ? CIN I (cervical intraepithelial neoplasia I) 06/01/2012  ? Contact with or exposure to venereal diseases 02/18/2012  ? Breast lump in upper outer quadrant 02/18/2012  ? ? ?Past Surgical History:  ?Procedure Laterality Date  ? COLPOSCOPY    ? RHINOPLASTY  2002  ? wisdom teeth    ? ? ?Current Outpatient Medications  ?Medication Sig Dispense Refill  ? acetaminophen (TYLENOL) 325 MG tablet Take 2 tablets (650 mg total) by mouth every 6 (six) hours as needed (for pain scale < 4). 60 tablet 0  ? naratriptan (AMERGE) 2.5 MG tablet Take 1 tablet (2.5 mg total) by mouth as needed for migraine. Take one (1) tablet at onset of headache; if returns or does not resolve, may repeat after 2 hours; do not exceed five (5) mg in 24 hours. 9 tablet 11  ? ondansetron (ZOFRAN-ODT) 4 MG disintegrating tablet Take 4 mg by mouth every 8 (eight) hours as needed.    ? Rimegepant Sulfate (NURTEC) 75 MG TBDP Take 75 mg by mouth daily as needed. For migraines. Take as close to onset of migraine as possible. One daily maximum. 16 tablet 6  ? tiZANidine (ZANAFLEX) 4 MG tablet SMARTSIG:1-2 Tablet(s) By Mouth 1-3 Times Daily PRN    ? ?No current facility-administered medications for this visit.  ? ? ?Allergies as of 02/21/2022  ? (No Known Allergies)  ? ? ?Vitals: ?BP 124/78   Pulse 85   Ht 5\' 6"  (1.676 m)   Wt 199 lb 12.8 oz (90.6 kg)   LMP 01/28/2022 (Exact Date)   BMI 32.25 kg/m?  ?Last Weight:  ?Wt Readings from Last 1 Encounters:  ?02/21/22 199 lb 12.8 oz (90.6 kg)  ? ?Last Height:   ?Ht Readings from Last 1 Encounters:  ?02/21/22 5\' 6"  (1.676 m)  ? ? ? ?Physical exam: ?Exam: ?Gen: NAD, conversant, well nourised, obese, well groomed                     ?CV: RRR, no MRG. No Carotid Bruits. No peripheral edema, warm, nontender ?Eyes: Conjunctivae clear  without exudates or hemorrhage ? ?Neuro: ?Detailed Neurologic Exam ? ?Speech: ?   Speech is normal; fluent and spontaneous with normal comprehension.  ?Cognition: ?   The patient is oriented to person, place, and time;  ?   recent and remote memory intact;  ?   language fluent;  ?   normal attention, concentration,  ?   fund of knowledge ?Cranial Nerves: ?   The pupils are equal, round, and reactive to light. The fundi are normal and spontaneous venous pulsations are present. Visual fields are full to finger confrontation. Extraocular movements are intact. Trigeminal sensation is intact and the muscles of mastication are normal. The face is symmetric. The palate elevates in the  midline. Hearing intact. Voice is normal. Shoulder shrug is normal. The tongue has normal motion without fasciculations.  ? ?Coordination: ?   Normal  ? ?Gait: ?  normal.  ? ?Motor Observation: ?   No asymmetry, no atrophy, and no involuntary movements noted. ?Tone: ?   Normal muscle tone.   ? ?Posture: ?   Posture is normal. normal erect ?   ?Strength: ?   Strength is V/V in the upper and lower limbs.  ?    ?Sensation: intact to LT ?    ?Reflex Exam: ? ?DTR's: ?   Deep tendon reflexes in the upper and lower extremities are normal bilaterally.   ?Toes: ?   The toes are downgoing bilaterally.   ?Clonus: ?   Clonus is absent. ?  ? ?Assessment/Plan:  Patient with migraine without aura, episodic migraines, She can have 4-6 migraine days a month but less than 10 total headache days a month. ? ?No recent labs, will check ?At ONSET of migraine: Amerge, Ondansetron, and Nurtec, and with alleve or tylenol ? ?Tried sumatriptan for 10-15 years stopped working, Rizatriptan 2 years stopped working, tried [employer] ineffective, on amerge now, ondansetron, topiramate(side effects mental confusion), nortriptyline (caused sedation), tried propanolol (hypotension). Ondansetron, nurtec, tylenol, stadol, benadryl, advil, oxy,reglan ? ?Orders Placed This  Encounter  ?Procedures  ? CBC with Differential/Platelets  ? Comprehensive metabolic panel  ? TSH  ? ? ?Meds ordered this encounter  ?Medications  ? naratriptan (AMERGE) 2.5 MG tablet  ?  Sig: Take 1 tablet (2.5 mg total) by

## 2022-02-21 NOTE — Patient Instructions (Addendum)
At ONSET of migraine: Amerge, Ondansetron, and Nurtec, and with alleve or tylenol ? ?Rimegepant Disintegrating Tablets ?What is this medication? ?RIMEGEPANT (ri ME je pant) prevents and treats migraines. It works by blocking a substance in the body that causes migraines. ?This medicine may be used for other purposes; ask your health care provider or pharmacist if you have questions. ?COMMON BRAND NAME(S): NURTEC ODT ?What should I tell my care team before I take this medication? ?They need to know if you have any of these conditions: ?Kidney disease ?Liver disease ?An unusual or allergic reaction to rimegepant, other medications, foods, dyes, or preservatives ?Pregnant or trying to get pregnant ?Breast-feeding ?How should I use this medication? ?Take this medication by mouth. Take it as directed on the prescription label. Leave the tablet in the sealed pack until you are ready to take it. With dry hands, open the pack and gently remove the tablet. If the tablet breaks or crumbles, throw it away. Use a new tablet. Place the tablet in the mouth and allow it to dissolve. Then, swallow it. Do not cut, crush, or chew this medication. You do not need water to take this medication. ?Talk to your care team about the use of this medication in children. Special care may be needed. ?Overdosage: If you think you have taken too much of this medicine contact a poison control center or emergency room at once. ?NOTE: This medicine is only for you. Do not share this medicine with others. ?What if I miss a dose? ?This does not apply. This medication is not for regular use. ?What may interact with this medication? ?Certain medications for fungal infections, such as fluconazole, itraconazole ?Rifampin ?This list may not describe all possible interactions. Give your health care provider a list of all the medicines, herbs, non-prescription drugs, or dietary supplements you use. Also tell them if you smoke, drink alcohol, or use illegal  drugs. Some items may interact with your medicine. ?What should I watch for while using this medication? ?Visit your care team for regular checks on your progress. Tell your care team if your symptoms do not start to get better or if they get worse. ?What side effects may I notice from receiving this medication? ?Side effects that you should report to your care team as soon as possible: ?Allergic reactions--skin rash, itching, hives, swelling of the face, lips, tongue, or throat ?Side effects that usually do not require medical attention (report to your care team if they continue or are bothersome): ?Nausea ?Stomach pain ?This list may not describe all possible side effects. Call your doctor for medical advice about side effects. You may report side effects to FDA at 1-800-FDA-1088. ?Where should I keep my medication? ?Keep out of the reach of children and pets. ?Store at room temperature between 20 and 25 degrees C (68 and 77 degrees F). Get rid of any unused medication after the expiration date. ?To get rid of medications that are no longer needed or have expired: ?Take the medication to a medication take-back program. Check with your pharmacy or law enforcement to find a location. ?If you cannot return the medication, check the label or package insert to see if the medication should be thrown out in the garbage or flushed down the toilet. If you are not sure, ask your care team. If it is safe to put it in the trash, take the medication out of the container. Mix the medication with cat litter, dirt, coffee grounds, or other unwanted substance. Seal  the mixture in a bag or container. Put it in the trash. ?NOTE: This sheet is a summary. It may not cover all possible information. If you have questions about this medicine, talk to your doctor, pharmacist, or health care provider. ?? 2023 Elsevier/Gold Standard (2021-11-21 00:00:00) ?Ondansetron Dissolving Tablets ?What is this medication? ?ONDANSETRON (on DAN se tron)  prevents nausea and vomiting from chemotherapy, radiation, or surgery. It works by blocking substances in the body that may cause nausea or vomiting. It belongs to a group of medications called antiemetics. ?This medicine may be used for other purposes; ask your health care provider or pharmacist if you have questions. ?COMMON BRAND NAME(S): Zofran ODT ?What should I tell my care team before I take this medication? ?They need to know if you have any of these conditions: ?Heart disease ?History of irregular heartbeat ?Liver disease ?Low levels of magnesium or potassium in the blood ?An unusual or allergic reaction to ondansetron, granisetron, other medications, foods, dyes, or preservatives ?Pregnant or trying to get pregnant ?Breast-feeding ?How should I use this medication? ?These tablets are made to dissolve in the mouth. Do not try to push the tablet through the foil backing. With dry hands, peel away the foil backing and gently remove the tablet. Place the tablet in the mouth and allow it to dissolve, then swallow. While you may take these tablets with water, it is not necessary to do so. ?Talk to your care team regarding the use of this medication in children. Special care may be needed. ?Overdosage: If you think you have taken too much of this medicine contact a poison control center or emergency room at once. ?NOTE: This medicine is only for you. Do not share this medicine with others. ?What if I miss a dose? ?If you miss a dose, take it as soon as you can. If it is almost time for your next dose, take only that dose. Do not take double or extra doses. ?What may interact with this medication? ?Do not take this medication with any of the following: ?Apomorphine ?Certain medications for fungal infections like fluconazole, itraconazole, ketoconazole, posaconazole, voriconazole ?Cisapride ?Dronedarone ?Pimozide ?Thioridazine ?This medication may also interact with the following: ?Carbamazepine ?Certain  medications for depression, anxiety, or psychotic disturbances ?Fentanyl ?Linezolid ?MAOIs like Carbex, Eldepryl, Marplan, Nardil, and Parnate ?Methylene blue (injected into a vein) ?Other medications that prolong the QT interval (cause an abnormal heart rhythm) like dofetilide, ziprasidone ?Phenytoin ?Rifampicin ?Tramadol ?This list may not describe all possible interactions. Give your health care provider a list of all the medicines, herbs, non-prescription drugs, or dietary supplements you use. Also tell them if you smoke, drink alcohol, or use illegal drugs. Some items may interact with your medicine. ?What should I watch for while using this medication? ?Check with your care team as soon as you can if you have any sign of an allergic reaction. ?What side effects may I notice from receiving this medication? ?Side effects that you should report to your care team as soon as possible: ?Allergic reactions--skin rash, itching, hives, swelling of the face, lips, tongue, or throat ?Bowel blockage--stomach cramping, unable to have a bowel movement or pass gas, loss of appetite, vomiting ?Chest pain (angina)--pain, pressure, or tightness in the chest, neck, back, or arms ?Heart rhythm changes--fast or irregular heartbeat, dizziness, feeling faint or lightheaded, chest pain, trouble breathing ?Irritability, confusion, fast or irregular heartbeat, muscle stiffness, twitching muscles, sweating, high fever, seizure, chills, vomiting, diarrhea, which may be signs of serotonin syndrome ?Side  effects that usually do not require medical attention (report to your care team if they continue or are bothersome): ?Constipation ?Diarrhea ?General discomfort and fatigue ?Headache ?This list may not describe all possible side effects. Call your doctor for medical advice about side effects. You may report side effects to FDA at 1-800-FDA-1088. ?Where should I keep my medication? ?Keep out of the reach of children and pets. ?Store between  2 and 30 degrees C (36 and 86 degrees F). Throw away any unused medication after the expiration date. ?NOTE: This sheet is a summary. It may not cover all possible information. If you have questions about this

## 2022-02-22 LAB — CBC WITH DIFFERENTIAL/PLATELET
Basophils Absolute: 0.1 10*3/uL (ref 0.0–0.2)
Basos: 1 %
EOS (ABSOLUTE): 0.1 10*3/uL (ref 0.0–0.4)
Eos: 2 %
Hematocrit: 39.7 % (ref 34.0–46.6)
Hemoglobin: 13.2 g/dL (ref 11.1–15.9)
Immature Grans (Abs): 0 10*3/uL (ref 0.0–0.1)
Immature Granulocytes: 0 %
Lymphocytes Absolute: 2.1 10*3/uL (ref 0.7–3.1)
Lymphs: 32 %
MCH: 30.4 pg (ref 26.6–33.0)
MCHC: 33.2 g/dL (ref 31.5–35.7)
MCV: 92 fL (ref 79–97)
Monocytes Absolute: 0.5 10*3/uL (ref 0.1–0.9)
Monocytes: 8 %
Neutrophils Absolute: 3.7 10*3/uL (ref 1.4–7.0)
Neutrophils: 57 %
Platelets: 338 10*3/uL (ref 150–450)
RBC: 4.34 x10E6/uL (ref 3.77–5.28)
RDW: 12.9 % (ref 11.7–15.4)
WBC: 6.4 10*3/uL (ref 3.4–10.8)

## 2022-02-22 LAB — COMPREHENSIVE METABOLIC PANEL
ALT: 15 IU/L (ref 0–32)
AST: 14 IU/L (ref 0–40)
Albumin/Globulin Ratio: 1.6 (ref 1.2–2.2)
Albumin: 4.4 g/dL (ref 3.8–4.8)
Alkaline Phosphatase: 85 IU/L (ref 44–121)
BUN/Creatinine Ratio: 16 (ref 9–23)
BUN: 14 mg/dL (ref 6–20)
Bilirubin Total: 0.3 mg/dL (ref 0.0–1.2)
CO2: 22 mmol/L (ref 20–29)
Calcium: 9.3 mg/dL (ref 8.7–10.2)
Chloride: 104 mmol/L (ref 96–106)
Creatinine, Ser: 0.85 mg/dL (ref 0.57–1.00)
Globulin, Total: 2.7 g/dL (ref 1.5–4.5)
Glucose: 91 mg/dL (ref 70–99)
Potassium: 4.5 mmol/L (ref 3.5–5.2)
Sodium: 139 mmol/L (ref 134–144)
Total Protein: 7.1 g/dL (ref 6.0–8.5)
eGFR: 90 mL/min/{1.73_m2} (ref >59)

## 2022-02-22 LAB — TSH: TSH: 1.51 u[IU]/mL (ref 0.450–4.500)

## 2022-03-12 ENCOUNTER — Telehealth: Payer: Self-pay | Admitting: *Deleted

## 2022-03-12 NOTE — Telephone Encounter (Signed)
PA Nurtec was faxed to [facility] this afternoon . Waiting on approval

## 2022-03-26 ENCOUNTER — Encounter: Payer: Self-pay | Admitting: *Deleted

## 2022-03-26 NOTE — Telephone Encounter (Signed)
LOMN signed by Dr Lucia Gaskins. Denial letter received from [facility]. Denial letter, office note, and LOMN faxed to [facility]. Received a receipt of confirmation.

## 2022-03-26 NOTE — Telephone Encounter (Signed)
Received a fax from [facility] stating Nurtec was denied. Will appeal. Letter written. Called [facility] and requested copy of denial letter to include with letter and clinical notes. [facility] will send to plan our behalf. Fax all info to [facility] at [phone] when ready.

## 2022-03-27 NOTE — Telephone Encounter (Signed)
Received fax asking for more information.  I completed [facility] form, faxed back to [phone] with confirmation received.

## 2022-03-27 NOTE — Telephone Encounter (Signed)
[facility] Appeal Department ([facility]) calling to get further medical information for appeal for prior authorization Will be faxing over details  Contact info: [phone]

## 2022-04-01 NOTE — Telephone Encounter (Signed)
NURTEC 75mg  PA approved for 3 months thru 06-28-2022 ref # [id number].  OPTUM [id number].

## 2022-08-06 DIAGNOSIS — B079 Viral wart, unspecified: Secondary | ICD-10-CM | POA: Diagnosis not present

## 2022-08-06 DIAGNOSIS — L821 Other seborrheic keratosis: Secondary | ICD-10-CM | POA: Diagnosis not present

## 2022-08-26 ENCOUNTER — Telehealth: Payer: BC Managed Care – PPO | Admitting: Neurology

## 2022-10-10 DIAGNOSIS — Z124 Encounter for screening for malignant neoplasm of cervix: Secondary | ICD-10-CM | POA: Diagnosis not present

## 2022-10-10 DIAGNOSIS — Z1151 Encounter for screening for human papillomavirus (HPV): Secondary | ICD-10-CM | POA: Diagnosis not present

## 2022-10-10 DIAGNOSIS — Z6831 Body mass index (BMI) 31.0-31.9, adult: Secondary | ICD-10-CM | POA: Diagnosis not present

## 2022-10-10 DIAGNOSIS — Z01419 Encounter for gynecological examination (general) (routine) without abnormal findings: Secondary | ICD-10-CM | POA: Diagnosis not present

## 2022-12-04 DIAGNOSIS — L570 Actinic keratosis: Secondary | ICD-10-CM | POA: Diagnosis not present

## 2022-12-04 DIAGNOSIS — D1801 Hemangioma of skin and subcutaneous tissue: Secondary | ICD-10-CM | POA: Diagnosis not present

## 2022-12-04 DIAGNOSIS — L821 Other seborrheic keratosis: Secondary | ICD-10-CM | POA: Diagnosis not present

## 2022-12-04 DIAGNOSIS — L578 Other skin changes due to chronic exposure to nonionizing radiation: Secondary | ICD-10-CM | POA: Diagnosis not present

## 2022-12-04 DIAGNOSIS — L814 Other melanin hyperpigmentation: Secondary | ICD-10-CM | POA: Diagnosis not present

## 2023-03-27 DIAGNOSIS — R079 Chest pain, unspecified: Secondary | ICD-10-CM | POA: Diagnosis not present

## 2023-03-27 DIAGNOSIS — M7989 Other specified soft tissue disorders: Secondary | ICD-10-CM | POA: Diagnosis not present

## 2023-03-27 DIAGNOSIS — G43E09 Chronic migraine with aura, not intractable, without status migrainosus: Secondary | ICD-10-CM | POA: Diagnosis not present

## 2023-03-27 DIAGNOSIS — E6609 Other obesity due to excess calories: Secondary | ICD-10-CM | POA: Diagnosis not present

## 2023-04-16 DIAGNOSIS — G43109 Migraine with aura, not intractable, without status migrainosus: Secondary | ICD-10-CM | POA: Diagnosis not present

## 2023-07-10 ENCOUNTER — Other Ambulatory Visit (HOSPITAL_BASED_OUTPATIENT_CLINIC_OR_DEPARTMENT_OTHER): Payer: Self-pay

## 2023-07-10 DIAGNOSIS — E782 Mixed hyperlipidemia: Secondary | ICD-10-CM | POA: Diagnosis not present

## 2023-07-10 DIAGNOSIS — Z6831 Body mass index (BMI) 31.0-31.9, adult: Secondary | ICD-10-CM | POA: Diagnosis not present

## 2023-07-10 DIAGNOSIS — G43E09 Chronic migraine with aura, not intractable, without status migrainosus: Secondary | ICD-10-CM | POA: Diagnosis not present

## 2023-07-10 DIAGNOSIS — N870 Mild cervical dysplasia: Secondary | ICD-10-CM | POA: Diagnosis not present

## 2023-07-10 DIAGNOSIS — E6609 Other obesity due to excess calories: Secondary | ICD-10-CM | POA: Diagnosis not present

## 2023-07-10 MED ORDER — WEGOVY 1 MG/0.5ML ~~LOC~~ SOAJ: 1.0000 mg | SUBCUTANEOUS | 0 refills | Status: DC

## 2023-07-10 MED ORDER — WEGOVY 0.5 MG/0.5ML ~~LOC~~ SOAJ: 0.5000 mg | SUBCUTANEOUS | 0 refills | Status: DC

## 2023-07-10 MED ORDER — WEGOVY 0.25 MG/0.5ML ~~LOC~~ SOAJ
0.2500 mg | SUBCUTANEOUS | 0 refills | Status: DC
  Filled 2023-07-10 (×2): qty 2, 28d supply, fill #0

## 2023-07-10 MED ORDER — WEGOVY 1.7 MG/0.75ML ~~LOC~~ SOAJ: 1.7000 mg | SUBCUTANEOUS | 3 refills | Status: DC

## 2023-07-16 ENCOUNTER — Other Ambulatory Visit (HOSPITAL_BASED_OUTPATIENT_CLINIC_OR_DEPARTMENT_OTHER): Payer: Self-pay

## 2023-08-28 DIAGNOSIS — E782 Mixed hyperlipidemia: Secondary | ICD-10-CM | POA: Diagnosis not present

## 2023-08-28 DIAGNOSIS — N870 Mild cervical dysplasia: Secondary | ICD-10-CM | POA: Diagnosis not present

## 2023-08-28 DIAGNOSIS — E6609 Other obesity due to excess calories: Secondary | ICD-10-CM | POA: Diagnosis not present

## 2023-08-28 DIAGNOSIS — Z6830 Body mass index (BMI) 30.0-30.9, adult: Secondary | ICD-10-CM | POA: Diagnosis not present

## 2023-08-28 DIAGNOSIS — E66811 Obesity, class 1: Secondary | ICD-10-CM | POA: Diagnosis not present

## 2023-08-28 DIAGNOSIS — R748 Abnormal levels of other serum enzymes: Secondary | ICD-10-CM | POA: Diagnosis not present

## 2023-11-14 ENCOUNTER — Other Ambulatory Visit (HOSPITAL_BASED_OUTPATIENT_CLINIC_OR_DEPARTMENT_OTHER): Payer: Self-pay

## 2023-11-14 MED ORDER — ZEPBOUND 5 MG/0.5ML ~~LOC~~ SOAJ
5.0000 mg | SUBCUTANEOUS | 5 refills | Status: AC
Start: 2023-11-14 — End: ?
  Filled 2023-11-14 – 2023-12-09 (×2): qty 2, 28d supply, fill #0

## 2023-12-08 DIAGNOSIS — Z0289 Encounter for other administrative examinations: Secondary | ICD-10-CM

## 2023-12-09 ENCOUNTER — Other Ambulatory Visit (HOSPITAL_BASED_OUTPATIENT_CLINIC_OR_DEPARTMENT_OTHER): Payer: Self-pay

## 2023-12-19 ENCOUNTER — Other Ambulatory Visit (HOSPITAL_BASED_OUTPATIENT_CLINIC_OR_DEPARTMENT_OTHER): Payer: Self-pay

## 2023-12-19 ENCOUNTER — Encounter: Payer: Self-pay | Admitting: Neurology

## 2024-01-05 ENCOUNTER — Ambulatory Visit: Admitting: Family Medicine

## 2024-01-05 ENCOUNTER — Encounter: Payer: Self-pay | Admitting: Family Medicine

## 2024-01-05 ENCOUNTER — Ambulatory Visit (INDEPENDENT_AMBULATORY_CARE_PROVIDER_SITE_OTHER)

## 2024-01-05 ENCOUNTER — Other Ambulatory Visit: Payer: Self-pay

## 2024-01-05 VITALS — BP 130/86 | HR 81 | Ht 66.0 in | Wt 175.0 lb

## 2024-01-05 DIAGNOSIS — M25572 Pain in left ankle and joints of left foot: Secondary | ICD-10-CM | POA: Diagnosis not present

## 2024-01-05 NOTE — Progress Notes (Signed)
   I, Stevenson Clinch, CMA acting as a scribe for Clementeen Graham, MD.  Catherine Price is a 43 y.o. female who presents to [facility] at [facility] today for L ankle pain x 1 day. Pt suffered a fall, twisting the ankle, inversion injury. Pt locates pain to lateral aspect of the ankle. Swelling present. Some pain with WB. Hx of ankle fracture.   L ankle swelling: yes Aggravates: WB Treatments tried: ice, elevation, Aleve  Pertinent review of systems: No fevers or chills.  Relevant historical information: History of left ankle injury in the past.  She may have broken it she thinks at some point in the past.   Exam:  BP 130/86   Pulse 81   Ht 5\' 6"  (1.676 m)   Wt 175 lb (79.4 kg)   SpO2 98%   BMI 28.25 kg/m  General: Well Developed, well nourished, and in no acute distress.   MSK: Ankle significant swelling along the anterior and lateral ankle. Tender palpation at lateral malleolus. Normal ankle motion. Stable ligamentous exam. Intact strength.    Lab and Radiology Results  X-ray images left ankle and left foot obtained today personally and independently interpreted. Old avulsion fracture from the talus is visible on the lateral ankle and foot x-ray.  No acute fractures are visible. Soft tissue swelling is present. Await formal radiology review.     Assessment and Plan: 42 y.o. female with left ankle pain and swelling after an inversion injury.  She does have what looks like an old avulsion type injury at the talus.  This could be an accessory ossicle as well.  I do not think this is a new injury.  Diagnosis today is ankle sprain.  Plan for cam walker boot as needed.  Work on home exercise program.  Recheck in 2 weeks.  Should be going to the beach for spring break in about 3 weeks. She likes to play tennis.  Avoid tennis right now.  Consider physical therapy.  PDMP not reviewed this encounter. Orders Placed This Encounter  Procedures   DG Ankle  Complete Left    Standing Status:   Future    Number of Occurrences:   1    Expiration Date:   01/04/2025    Reason for Exam (SYMPTOM  OR DIAGNOSIS REQUIRED):   left ankle pain    Preferred imaging location?:   [place] [facility]    Is patient pregnant?:   No   No orders of the defined types were placed in this encounter.    Discussed warning signs or symptoms. Please see discharge instructions. Patient expresses understanding.   The above documentation has been reviewed and is accurate and complete Clementeen Graham, M.D.

## 2024-01-05 NOTE — Patient Instructions (Signed)
 Thank you for coming in today.   Please go to [facility] supply to get the CAM Catherine Price we talked about today. You may also be able to get it from [employer].    Work on the range of motion exercises  Check back in 2 weeks

## 2024-01-06 ENCOUNTER — Encounter: Payer: Self-pay | Admitting: Family Medicine

## 2024-01-06 ENCOUNTER — Ambulatory Visit: Admitting: Sports Medicine

## 2024-01-06 NOTE — Progress Notes (Signed)
 Left ankle x-ray shows medium soft tissue swelling at the lateral ankle.  No acute fracture is visible.  That little piece of bone we are looking at the radiologist thinks it is old and not new fracture.

## 2024-01-14 ENCOUNTER — Encounter: Payer: Self-pay | Admitting: Neurology

## 2024-01-19 ENCOUNTER — Ambulatory Visit (INDEPENDENT_AMBULATORY_CARE_PROVIDER_SITE_OTHER)

## 2024-01-19 ENCOUNTER — Other Ambulatory Visit: Payer: Self-pay

## 2024-01-19 ENCOUNTER — Ambulatory Visit: Admitting: Family Medicine

## 2024-01-19 VITALS — BP 114/78 | HR 72 | Ht 66.0 in

## 2024-01-19 DIAGNOSIS — M25572 Pain in left ankle and joints of left foot: Secondary | ICD-10-CM | POA: Diagnosis not present

## 2024-01-19 NOTE — Progress Notes (Signed)
   Rubin Payor, PhD, LAT, ATC acting as a scribe for Clementeen Graham, MD.  Catherine Price is a 43 y.o. female who presents to [facility] at [facility] today for 2-wk f/u L ankle pain. Pt was last seen by Dr. Denyse Amass on 01/05/24 and was advised to avoid tennis, use a CAM walker boot prn, and taught HEP.  Today, pt reports she has been wearing a boot. Swelling has improved. She started wearing the boot, do to overdoing it. Slight tenderness over the talocrural joint, laterally.  She notes some swelling and tenderness into the dorsal foot as well. She is going to the beach next week for spring break.  Dx imaging: 01/05/24 L ankle XR  Pertinent review of systems: No fevers or chills  Relevant historical information: Otherwise healthy   Exam:  BP 114/78   Pulse 72   Ht 5\' 6"  (1.676 m)   LMP 12/31/2023   SpO2 98%   BMI 28.25 kg/m  General: Well Developed, well nourished, and in no acute distress.   MSK: Right foot and ankle bruising present in the medial lateral ankle and into the dorsal foot.  Tender palpation anterior lateral ankle and into the dorsal foot.  Normal ankle motion.    Lab and Radiology Results  X-ray images left ankle and foot obtained today personally and independently interpreted.  Left ankle: No acute fracture.  Corticated ossicle again visible.  Left foot: No acute fracture.  Soft tissue swelling is present.  Await formal radiology review    Assessment and Plan: 43 y.o. female with resolving left foot and ankle pain and swelling after an inversion injury.  Symptoms and x-ray are most consistent with ankle sprain.  There could be a tiny fracture not visible well on x-ray to my read.  Plan to resume ankle range of motion and stabilizing exercises.  Exercises taught by ATC in clinic prior to discharge.  Recommend using a cam walker boot for protection while on the beach.  Recheck in about 3 weeks.  Consider formal physical therapy if  needed.   PDMP not reviewed this encounter. Orders Placed This Encounter  Procedures   DG Ankle Complete Left    Standing Status:   Future    Number of Occurrences:   1    Expiration Date:   01/18/2025    Reason for Exam (SYMPTOM  OR DIAGNOSIS REQUIRED):   left foot pain    Preferred imaging location?:   [place]    Is patient pregnant?:   No   DG Foot Complete Left    Standing Status:   Future    Number of Occurrences:   1    Expiration Date:   01/18/2025    Reason for Exam (SYMPTOM  OR DIAGNOSIS REQUIRED):   left foot pain    Preferred imaging location?:   [place]    Is patient pregnant?:   No   No orders of the defined types were placed in this encounter.    Discussed warning signs or symptoms. Please see discharge instructions. Patient expresses understanding.   The above documentation has been reviewed and is accurate and complete Clementeen Graham, M.D.

## 2024-01-19 NOTE — Patient Instructions (Addendum)
 Thank you for coming in today.   Please get an Xray today before you leave   Please work on the home exercises the athletic trainer went over with you:  View at [email or web address] using code [email or web address]  Check back in 3 weeks

## 2024-01-23 ENCOUNTER — Encounter: Payer: Self-pay | Admitting: Family Medicine

## 2024-01-23 NOTE — Progress Notes (Signed)
 Left foot x-ray shows some soft tissue swelling but no acute fractures.

## 2024-01-23 NOTE — Progress Notes (Signed)
 Left ankle x-ray shows no fractures are visible.  Ankle swelling is decreased from last x-ray in March.

## 2024-02-09 ENCOUNTER — Encounter: Payer: Self-pay | Admitting: Neurology

## 2024-02-09 ENCOUNTER — Ambulatory Visit: Admitting: Family Medicine

## 2024-02-09 ENCOUNTER — Ambulatory Visit: Admitting: Neurology

## 2024-02-09 VITALS — BP 125/95 | HR 78 | Ht 66.0 in | Wt 178.0 lb

## 2024-02-09 DIAGNOSIS — G43109 Migraine with aura, not intractable, without status migrainosus: Secondary | ICD-10-CM | POA: Diagnosis not present

## 2024-02-09 MED ORDER — NARATRIPTAN HCL 2.5 MG PO TABS: 2.5000 mg | ORAL_TABLET | ORAL | 11 refills | Status: AC | PRN

## 2024-02-09 MED ORDER — NURTEC 75 MG PO TBDP: 75.0000 mg | ORAL_TABLET | Freq: Every day | ORAL | 11 refills | Status: DC | PRN

## 2024-02-09 MED ORDER — NURTEC 75 MG PO TBDP: 75.0000 mg | ORAL_TABLET | Freq: Every day | ORAL | 11 refills | Status: AC | PRN

## 2024-02-09 MED ORDER — ONDANSETRON 8 MG PO TBDP: 8.0000 mg | ORAL_TABLET | Freq: Three times a day (TID) | ORAL | 11 refills | Status: AC | PRN

## 2024-02-09 NOTE — Progress Notes (Signed)
 [facility]    Provider:  Dr Tresia Fruit Requesting Provider: Aleta Anda, MD Primary Care Provider:  Aleta Anda, MD  CC:  migraines  02/09/2024: Nurtec is amazing. She is having 4 migraine days a month, more stress, the nurtec works great. Has failed imitrex, maxalt, naratriptan , ubrelvy  (tried > 3 months). Continue nurtec. No other focal neurologic deficits, associated symptoms, inciting events or modifiable factors. Patient complains of symptoms per HPI as well as the following symptoms: none . Pertinent negatives and positives per HPI. All others negative   HPI 02/21/2022:  Catherine Price is a 43 y.o. female here as requested by Aleta Anda, MD for migraines. Started in her 74s. Sister had migraines. Her migraines are once a month and can last 3-4 months around her menses but can happen at other times. Menstrually related migraines, but can get them with weather changes, perfumes, smells, onions, red wine. She may get tingling but not consistent and not before the migraine, never associated with migraines, no aura. She tried nurtec samples and it helps.She can have 4-6 migraine days a month but less than 10 total headache days a month. Had been to the ED, significantly affecting quality of life, she woke up with severe migraine, migraines are pulsating/pounding/throbbing, light/smell sensitivity, nausea, vomiting, hrts to move, moderate to severe in pain, last 4-72 hours. Ongoing at this freq and severity for years. No red flags like not positional or exertional, no vision changes. She had a migraine cocktail and felt better in the ED, nurtec works great for her.   Reviewed notes, labs and imaging from outside physicians, which showed:  09/24/2019: FINDINGS: Brain: The brain has a normal appearance without evidence of malformation, atrophy, old or acute small or large vessel infarction, mass lesion, hemorrhage, hydrocephalus or extra-axial collection.  No sign of demyelinating disease. After contrast administration, no abnormal enhancement occurs.   Vascular: Major vessels at the base of the brain show flow. Venous sinuses appear patent.   Skull and upper cervical spine: Normal.   Sinuses/Orbits: Clear/normal.   Other: None significant.   IMPRESSION: Normal examination. No finding to explain the clinical presentation.    Tried sumatriptan for 10-15 years stopped working, Rizatriptan 2 years stopped working, tried ubrelvy  samples ineffective, on amerge now, ondansetron , topiramate(side effects mental confusion), nortriptyline (caused sedation), tried propanolol (hypotension). Ondansetron , nurtec, tylenol , stadol , benadryl , advil , oxy,reglan   Review of Systems: Patient complains of symptoms per HPI as well as the following symptoms migraies. Pertinent negatives and positives per HPI. All others negative.   Social History   Socioeconomic History   Marital status: Married    Spouse name: Andy Bannister   Number of children: 2   Years of education: Not on file   Highest education level: Bachelor's degree (e.g., BA, AB, BS)  Occupational History   Not on file  Tobacco Use   Smoking status: Never   Smokeless tobacco: Never  Vaping Use   Vaping status: Never Used  Substance and Sexual Activity   Alcohol use: No   Drug use: No   Sexual activity: Yes  Other Topics Concern   Not on file  Social History Narrative   Work or School: lab in [employer]      Home Situation: lives with husband and 2 chldren      Spiritual Beliefs: Christian      Lifestyle: no regular exercise; diet is good         Caffeine- coffee 2-3 x week  Social Drivers of Corporate investment banker Strain: Low Risk  (11/12/2023)   Received from [facility]   Overall Financial Resource Strain (CARDIA)    Difficulty of Paying Living Expenses: Not hard at all  Food Insecurity: No Food Insecurity (11/12/2023)   Received from [facility]   Hunger Vital  Sign    Worried About Running Out of Food in the Last Year: Never true    Ran Out of Food in the Last Year: Never true  Transportation Needs: No Transportation Needs (11/12/2023)   Received from [facility] - Transportation    Lack of Transportation (Medical): No    Lack of Transportation (Non-Medical): No  Physical Activity: Insufficiently Active (11/12/2023)   Received from [facility]   Exercise Vital Sign    Days of Exercise per Week: 3 days    Minutes of Exercise per Session: 30 min  Stress: No Stress Concern Present (11/12/2023)   Received from [facility] - Occupational Stress Questionnaire    Feeling of Stress : Only a little  Social Connections: Socially Integrated (11/12/2023)   Received from [facility]   Social Network    How would you rate your social network (family, work, friends)?: Good participation with social networks  Intimate Partner Violence: Not At Risk (11/12/2023)   Received from [facility]   HITS    Over the last 12 months how often did your partner physically hurt you?: Never    Over the last 12 months how often did your partner insult you or talk down to you?: Never    Over the last 12 months how often did your partner threaten you with physical harm?: Never    Over the last 12 months how often did your partner scream or curse at you?: Never    Family History  Problem Relation Age of Onset   Hypertension Mother    Hyperlipidemia Mother    Heart attack Mother    Diabetes Mother    Hypertension Father    Hyperlipidemia Father    Migraines Sister    Hypertension Sister    Alcoholism Brother    Diabetes type II Maternal Aunt    Diabetes Maternal Aunt     Past Medical History:  Diagnosis Date   Abnormal Pap smear    GAD (generalized anxiety disorder)    Migraine headache    Paresthesia    UTI (urinary tract infection)    Vaginal Pap smear, abnormal    Vitamin D deficiency      Patient Active Problem List   Diagnosis Date Noted   Normal labor and delivery 01/06/2016   CIN I (cervical intraepithelial neoplasia I) 06/01/2012   Exposure to sexually transmitted infection 02/18/2012   Breast lump in upper outer quadrant 02/18/2012    Past Surgical History:  Procedure Laterality Date   COLPOSCOPY     RHINOPLASTY  2002   wisdom teeth      Current Outpatient Medications  Medication Sig Dispense Refill   tirzepatide  (ZEPBOUND ) 5 MG/0.5ML Pen Inject 5 mg into the skin once a week. 2 mL 5   tiZANidine (ZANAFLEX) 4 MG tablet SMARTSIG:1-2 Tablet(s) By Mouth 1-3 Times Daily PRN     naratriptan  (AMERGE) 2.5 MG tablet Take 1 tablet (2.5 mg total) by mouth as needed for migraine. Take one (1) tablet at onset of headache; if returns or does not resolve, may repeat after 2 hours; do not exceed five (  5) mg in 24 hours. 9 tablet 11   ondansetron  (ZOFRAN -ODT) 8 MG disintegrating tablet Take 1 tablet (8 mg total) by mouth every 8 (eight) hours as needed. 20 tablet 11   Rimegepant Sulfate (NURTEC) 75 MG TBDP Take 1 tablet (75 mg total) by mouth daily as needed. For migraines. Take as close to onset of migraine as possible. One daily maximum. PLEASE RUN COPAY CARD BIN [id number] PCN CN GRP [id number] ID [id number] EXP 10/13/2024 16 tablet 11   No current facility-administered medications for this visit.    Allergies as of 02/09/2024   (No Known Allergies)    Vitals: BP (!) 125/95 (Cuff Size: Normal)   Pulse 78   Ht 5\' 6"  (1.676 m)   Wt 178 lb (80.7 kg)   LMP 12/31/2023 (Exact Date)   BMI 28.73 kg/m  Last Weight:  Wt Readings from Last 1 Encounters:  02/09/24 178 lb (80.7 kg)   Last Height:   Ht Readings from Last 1 Encounters:  02/09/24 5\' 6"  (1.676 m)      Physical exam: Exam: Gen: NAD, conversant      CV: No palpitations or chest pain or SOB. VS: Breathing at a normal rate. Not febrile. Eyes: Conjunctivae clear without exudates or  hemorrhage  Neuro: Detailed Neurologic Exam  Speech:    Speech is normal; fluent and spontaneous with normal comprehension.  Cognition:    The patient is oriented to person, place, and time;     recent and remote memory intact;     language fluent;     normal attention, concentration, fund of knowledge Cranial Nerves:    The pupils are equal, round, and reactive to light. Visual fields are full Extraocular movements are intact.  The face is symmetric with normal sensation. The palate elevates in the midline. Hearing intact. Voice is normal. Shoulder shrug is normal. The tongue has normal motion without fasciculations.   Coordination: normal  Gait:    No abnormalities noted or reported  Motor Observation:   no involuntary movements noted. Tone:    Appears normal  Posture:    Posture is normal. normal erect    Strength:    Strength is anti-gravity and symmetric in the upper and lower limbs.      Sensation: intact to LT, no reports of numbness or tingling or paresthesias        Assessment/Plan:  Patient with migraine without aura, episodic migraines, She can have 4-6 migraine days a month but less than 10 total headache days a month.  Nurtec is amazing. She is having 4 migraine days a month, more stress, the nurtec works great. Has failed imitrex, maxalt, naratriptan , ubrelvy  (tried > 3 months). Continue nurtec.  Amerge and zofran  as backup  Tried sumatriptan for 10-15 years stopped working, Rizatriptan 2 years stopped working, tried ubrelvy  samples ineffective, on amerge now, ondansetron , topiramate(side effects mental confusion), nortriptyline (caused sedation), tried propanolol (hypotension). Ondansetron , nurtec, tylenol , stadol , benadryl , advil , oxy,reglan   Meds ordered this encounter  Medications   DISCONTD: Rimegepant Sulfate (NURTEC) 75 MG TBDP    Sig: Take 1 tablet (75 mg total) by mouth daily as needed. For migraines. Take as close to onset of migraine as  possible. One daily maximum. PLEASE RUN COPAY CARD BIN [id number] PCN CN GRP [id number] ID [id number] EXP 10/13/2024    Dispense:  16 tablet    Refill:  11    PLEASE RUN COPAY CARD BIN [id number] PCN CN GRP [id number] ID [id number] EXP 10/13/2024  naratriptan  (AMERGE) 2.5 MG tablet    Sig: Take 1 tablet (2.5 mg total) by mouth as needed for migraine. Take one (1) tablet at onset of headache; if returns or does not resolve, may repeat after 2 hours; do not exceed five (5) mg in 24 hours.    Dispense:  9 tablet    Refill:  11   ondansetron  (ZOFRAN -ODT) 8 MG disintegrating tablet    Sig: Take 1 tablet (8 mg total) by mouth every 8 (eight) hours as needed.    Dispense:  20 tablet    Refill:  11   Rimegepant Sulfate (NURTEC) 75 MG TBDP    Sig: Take 1 tablet (75 mg total) by mouth daily as needed. For migraines. Take as close to onset of migraine as possible. One daily maximum. PLEASE RUN COPAY CARD BIN [id number] PCN CN GRP [id number] ID [id number] EXP 10/13/2024    Dispense:  16 tablet    Refill:  11    PLEASE RUN COPAY CARD BIN [id number] PCN CN GRP [id number] ID [id number] EXP 10/13/2024    Cc: Aleta Anda, MD,  Aleta Anda, MD  Aldona Amel, MD  [facility] [address] [place], [place] [address]  Phone [phone] Fax [phone]  I spent over 10 minutes of face-to-face and non-face-to-face time with patient on the  1. Migraine with aura and without status migrainosus, not intractable     diagnosis.  This included previsit chart review, lab review, study review, order entry, electronic health record documentation, patient education on the different diagnostic and therapeutic options, counseling and coordination of care, risks and benefits of management, compliance, or risk factor reduction

## 2024-02-09 NOTE — Progress Notes (Deleted)
   Joanna Muck, PhD, LAT, ATC acting as a scribe for Garlan Juniper, MD.  Catherine Price is a 43 y.o. female who presents to [facility] at [facility] today for f/u L foot and ankle pain. Pt was last seen by Dr. Alease Hunter on 01/19/24 and was advised to use a CAM walker boot while at the beach and was taught HEP.  Today, pt reports ***  Dx imaging: 01/05/24 L ankle XR   Pertinent review of systems: ***  Relevant historical information: ***   Exam:  LMP 12/31/2023 (Exact Date)  General: Well Developed, well nourished, and in no acute distress.   MSK: ***    Lab and Radiology Results No results found for this or any previous visit (from the past 72 hours). No results found.     Assessment and Plan: 43 y.o. female with ***   PDMP not reviewed this encounter. No orders of the defined types were placed in this encounter.  No orders of the defined types were placed in this encounter.    Discussed warning signs or symptoms. Please see discharge instructions. Patient expresses understanding.   ***

## 2024-02-10 ENCOUNTER — Telehealth: Payer: Self-pay | Admitting: *Deleted

## 2024-02-10 ENCOUNTER — Other Ambulatory Visit (HOSPITAL_COMMUNITY): Payer: Self-pay

## 2024-02-10 ENCOUNTER — Telehealth: Payer: Self-pay

## 2024-02-10 ENCOUNTER — Ambulatory Visit: Admitting: Family Medicine

## 2024-02-10 NOTE — Telephone Encounter (Signed)
 Pt reached out needing PA on NURTEC.

## 2024-02-10 NOTE — Telephone Encounter (Signed)
 Pharmacy Patient Advocate Encounter   Received notification from Physician's Office that prior authorization for Nurtec 75MG  dispersible tablets is required/requested.   Insurance verification completed.   The patient is insured through [facility] .   Per test claim: PA required; PA submitted to above mentioned insurance via CoverMyMeds Key/confirmation #/EOC [facility] Status is pending

## 2024-02-11 ENCOUNTER — Other Ambulatory Visit (HOSPITAL_COMMUNITY): Payer: Self-pay

## 2024-02-11 NOTE — Telephone Encounter (Signed)
 Pharmacy Patient Advocate Encounter  Received notification from [facility] that Prior Authorization for Nurtec 75MG  dispersible tablets has been APPROVED from 02/10/2024 to 02/09/2025. Ran test claim, Copay is $0. This test claim was processed through [facility]- copay amounts may vary at other pharmacies due to pharmacy/plan contracts, or as the patient moves through the different stages of their insurance plan.   PA #/Case ID/Reference #: PA Case ID #: [id number]

## 2024-02-13 NOTE — Progress Notes (Unsigned)
   Joanna Muck, PhD, LAT, ATC acting as a scribe for Garlan Juniper, MD.  Catherine Price is a 43 y.o. female who presents to [facility] at [facility] today for f/u L foot and ankle pain. Pt was last seen by Dr. Alease Hunter on 01/19/24 and was advised to use a CAM walker boot [place] the beach and was taught HEP.  Today, pt reports ***  Dx imaging: 01/05/24 L ankle XR   Pertinent review of systems: ***  Relevant historical information: ***   Exam:  LMP 12/31/2023 (Exact Date)  General: Well Developed, well nourished, and in no acute distress.   MSK: ***    Lab and Radiology Results No results found for this or any previous visit (from the past 72 hours). No results found.     Assessment and Plan: 43 y.o. female with ***   PDMP not reviewed this encounter. No orders of the defined types were placed in this encounter.  No orders of the defined types were placed in this encounter.    Discussed warning signs or symptoms. Please see discharge instructions. Patient expresses understanding.   ***

## 2024-02-16 ENCOUNTER — Encounter: Payer: Self-pay | Admitting: Family Medicine

## 2024-02-16 ENCOUNTER — Other Ambulatory Visit: Payer: Self-pay

## 2024-02-16 ENCOUNTER — Ambulatory Visit: Admitting: Family Medicine

## 2024-02-16 VITALS — BP 120/84 | HR 84 | Ht 66.0 in | Wt 180.0 lb

## 2024-02-16 DIAGNOSIS — M25572 Pain in left ankle and joints of left foot: Secondary | ICD-10-CM | POA: Diagnosis not present

## 2024-02-16 NOTE — Patient Instructions (Addendum)
 Thank you for coming in today.   Work on home exercises.   Use the brace for the next 2 months or so with athletic activity and uneven ground.   Recheck as needed.   Happy to arrange PT with a MyChart message or a phone call.   Practice tennis before you play.

## 2024-02-24 ENCOUNTER — Ambulatory Visit: Admitting: Neurology

## 2024-04-20 ENCOUNTER — Ambulatory Visit: Payer: BC Managed Care – PPO | Admitting: Neurology

## 2025-01-12 ENCOUNTER — Ambulatory Visit: Admitting: Neurology

## 2025-01-12 ENCOUNTER — Telehealth: Admitting: Neurology
